# Patient Record
Sex: Female | Born: 1960 | Race: White | Hispanic: No | Marital: Married | Smoking: Never smoker
Health system: Southern US, Community
[De-identification: ages and names within clinical notes are randomized; demographics above are authoritative.]

## PROBLEM LIST (undated history)

## (undated) DIAGNOSIS — R002 Palpitations: Secondary | ICD-10-CM

## (undated) DIAGNOSIS — M778 Other enthesopathies, not elsewhere classified: Secondary | ICD-10-CM

## (undated) DIAGNOSIS — R0600 Dyspnea, unspecified: Secondary | ICD-10-CM

## (undated) DIAGNOSIS — R079 Chest pain, unspecified: Secondary | ICD-10-CM

## (undated) DIAGNOSIS — E039 Hypothyroidism, unspecified: Secondary | ICD-10-CM

## (undated) DIAGNOSIS — I10 Essential (primary) hypertension: Secondary | ICD-10-CM

## (undated) DIAGNOSIS — R42 Dizziness and giddiness: Secondary | ICD-10-CM

## (undated) DIAGNOSIS — M7581 Other shoulder lesions, right shoulder: Secondary | ICD-10-CM

## (undated) HISTORY — DX: Chest pain, unspecified: R07.9

## (undated) HISTORY — DX: Dyspnea, unspecified: R06.00

## (undated) HISTORY — DX: Dizziness and giddiness: R42

## (undated) HISTORY — DX: Other enthesopathies, not elsewhere classified: M77.8

## (undated) HISTORY — DX: Essential (primary) hypertension: I10

## (undated) HISTORY — DX: Other shoulder lesions, right shoulder: M75.81

## (undated) HISTORY — DX: Hypothyroidism, unspecified: E03.9

## (undated) HISTORY — DX: Palpitations: R00.2

---

## 2000-05-29 ENCOUNTER — Ambulatory Visit: Admission: RE | Admit: 2000-05-29 | Discharge: 2000-05-29 | Payer: Self-pay | Admitting: Pulmonary Disease

## 2000-09-06 ENCOUNTER — Encounter: Admission: RE | Admit: 2000-09-06 | Discharge: 2000-09-06 | Payer: Self-pay | Admitting: Obstetrics and Gynecology

## 2000-09-06 ENCOUNTER — Encounter: Payer: Self-pay | Admitting: Obstetrics and Gynecology

## 2002-02-24 ENCOUNTER — Other Ambulatory Visit: Admission: RE | Admit: 2002-02-24 | Discharge: 2002-02-24 | Payer: Self-pay | Admitting: Obstetrics and Gynecology

## 2003-04-05 ENCOUNTER — Other Ambulatory Visit: Admission: RE | Admit: 2003-04-05 | Discharge: 2003-04-05 | Payer: Self-pay | Admitting: Obstetrics and Gynecology

## 2004-08-30 ENCOUNTER — Ambulatory Visit: Payer: Self-pay | Admitting: Cardiology

## 2004-09-28 ENCOUNTER — Ambulatory Visit (HOSPITAL_COMMUNITY): Admission: RE | Admit: 2004-09-28 | Discharge: 2004-09-28 | Payer: Self-pay | Admitting: Allergy

## 2005-04-26 ENCOUNTER — Ambulatory Visit: Payer: Self-pay

## 2007-05-14 ENCOUNTER — Encounter: Admission: RE | Admit: 2007-05-14 | Discharge: 2007-05-14 | Payer: Self-pay | Admitting: Obstetrics and Gynecology

## 2007-12-02 ENCOUNTER — Ambulatory Visit: Payer: Self-pay | Admitting: Cardiovascular Disease

## 2007-12-02 LAB — CONVERTED CEMR LAB
BUN: 10 mg/dL (ref 6–23)
Basophils Absolute: 0 10*3/uL (ref 0.0–0.1)
Basophils Relative: 0.1 % (ref 0.0–1.0)
CO2: 28 meq/L (ref 19–32)
Calcium: 9 mg/dL (ref 8.4–10.5)
Chloride: 106 meq/L (ref 96–112)
Creatinine, Ser: 0.9 mg/dL (ref 0.4–1.2)
Eosinophils Absolute: 0.2 10*3/uL (ref 0.0–0.6)
Eosinophils Relative: 1.8 % (ref 0.0–5.0)
GFR calc Af Amer: 86 mL/min
GFR calc non Af Amer: 71 mL/min
Glucose, Bld: 93 mg/dL (ref 70–99)
HCT: 40.1 % (ref 36.0–46.0)
Hemoglobin: 13.3 g/dL (ref 12.0–15.0)
Lymphocytes Relative: 17.6 % (ref 12.0–46.0)
MCHC: 33.3 g/dL (ref 30.0–36.0)
MCV: 85.8 fL (ref 78.0–100.0)
Monocytes Absolute: 0.7 10*3/uL (ref 0.2–0.7)
Monocytes Relative: 7.7 % (ref 3.0–11.0)
Neutro Abs: 6.4 10*3/uL (ref 1.4–7.7)
Neutrophils Relative %: 72.8 % (ref 43.0–77.0)
Platelets: 316 10*3/uL (ref 150–400)
Potassium: 4.9 meq/L (ref 3.5–5.1)
RBC: 4.67 M/uL (ref 3.87–5.11)
RDW: 13.6 % (ref 11.5–14.6)
Sodium: 142 meq/L (ref 135–145)
TSH: 1.83 microintl units/mL (ref 0.35–5.50)
WBC: 8.9 10*3/uL (ref 4.5–10.5)

## 2007-12-11 ENCOUNTER — Encounter: Payer: Self-pay | Admitting: Cardiology

## 2007-12-11 ENCOUNTER — Ambulatory Visit: Payer: Self-pay

## 2008-01-25 ENCOUNTER — Encounter: Payer: Self-pay | Admitting: Cardiology

## 2010-01-17 ENCOUNTER — Ambulatory Visit: Payer: Self-pay | Admitting: Cardiology

## 2010-01-17 ENCOUNTER — Observation Stay (HOSPITAL_COMMUNITY): Admission: EM | Admit: 2010-01-17 | Discharge: 2010-01-18 | Payer: Self-pay | Admitting: Emergency Medicine

## 2010-02-10 DIAGNOSIS — R002 Palpitations: Secondary | ICD-10-CM

## 2010-02-10 DIAGNOSIS — R079 Chest pain, unspecified: Secondary | ICD-10-CM

## 2010-02-10 DIAGNOSIS — R42 Dizziness and giddiness: Secondary | ICD-10-CM

## 2010-02-10 DIAGNOSIS — R0602 Shortness of breath: Secondary | ICD-10-CM

## 2010-02-10 DIAGNOSIS — I10 Essential (primary) hypertension: Secondary | ICD-10-CM | POA: Insufficient documentation

## 2010-02-11 ENCOUNTER — Encounter: Payer: Self-pay | Admitting: Cardiology

## 2010-02-13 ENCOUNTER — Ambulatory Visit: Payer: Self-pay | Admitting: Cardiology

## 2010-04-19 ENCOUNTER — Encounter (INDEPENDENT_AMBULATORY_CARE_PROVIDER_SITE_OTHER): Payer: Self-pay | Admitting: *Deleted

## 2010-04-20 ENCOUNTER — Ambulatory Visit: Payer: Self-pay

## 2010-04-20 ENCOUNTER — Ambulatory Visit (HOSPITAL_COMMUNITY): Admission: RE | Admit: 2010-04-20 | Discharge: 2010-04-20 | Payer: Self-pay | Admitting: Cardiology

## 2010-04-20 ENCOUNTER — Encounter: Payer: Self-pay | Admitting: Cardiology

## 2010-04-20 ENCOUNTER — Encounter (INDEPENDENT_AMBULATORY_CARE_PROVIDER_SITE_OTHER): Payer: Self-pay

## 2010-04-20 ENCOUNTER — Ambulatory Visit: Payer: Self-pay | Admitting: Cardiology

## 2010-04-24 ENCOUNTER — Ambulatory Visit: Payer: Self-pay | Admitting: Cardiology

## 2010-06-07 ENCOUNTER — Telehealth (INDEPENDENT_AMBULATORY_CARE_PROVIDER_SITE_OTHER): Payer: Self-pay | Admitting: *Deleted

## 2010-08-24 ENCOUNTER — Encounter (INDEPENDENT_AMBULATORY_CARE_PROVIDER_SITE_OTHER): Payer: Self-pay | Admitting: *Deleted

## 2010-10-06 ENCOUNTER — Encounter (INDEPENDENT_AMBULATORY_CARE_PROVIDER_SITE_OTHER): Payer: Self-pay | Admitting: *Deleted

## 2010-10-11 ENCOUNTER — Ambulatory Visit: Payer: Self-pay | Admitting: Internal Medicine

## 2010-10-24 ENCOUNTER — Ambulatory Visit: Payer: Self-pay | Admitting: Internal Medicine

## 2010-11-30 NOTE — Progress Notes (Signed)
  Request recieved from [employer] for Long Term [facility] Coverage.Sent to [employer]. [facility]  June 07, 2010 8:33 AM

## 2010-11-30 NOTE — Letter (Signed)
Summary: Outpatience Coinsurance Notice  Outpatience Coinsurance Notice   Imported By: Marylou Mccoy 05/19/2010 15:32:53  _____________________________________________________________________  External Attachment:    Type:   Image     Comment:   External Document

## 2010-11-30 NOTE — Miscellaneous (Signed)
Summary: lec pREVISIT/PREP  Clinical Lists Changes  Medications: Added new medication of MOVIPREP 100 GM  SOLR (PEG-KCL-NACL-NASULF-NA ASC-C) As per prep instructions. - Signed Rx of MOVIPREP 100 GM  SOLR (PEG-KCL-NACL-NASULF-NA ASC-C) As per prep instructions.;  #1 x 0;  Signed;  Entered by: Wyona Almas RN;  Authorized by: Iva Boop MD, [facility];  Method used: [facility]  [place] [address], [place], [place]  [address], Ph: [phone] or [phone], Fax: [phone] Allergies: Changed allergy or adverse reaction from CELEBREX to CELEBREX Changed allergy or adverse reaction from LISINOPRIL to LISINOPRIL    Prescriptions: MOVIPREP 100 GM  SOLR (PEG-KCL-NACL-NASULF-NA ASC-C) As per prep instructions.  #1 x 0   Entered by:   Wyona Almas RN   Authorized by:   Iva Boop MD, [facility]   Signed by:   Wyona Almas RN on 10/11/2010   Method used:   [facility]        [facility]  [place] [address] (retail)       [address]       [place], [place]  [address]       Ph: [phone] or [phone]       Fax: [phone]   RxID:   [id number]

## 2010-11-30 NOTE — Procedures (Signed)
Summary: Colonoscopy  Patient: Veronica Stuart Note: All result statuses are Final unless otherwise noted.  Tests: (1) Colonoscopy (COL)   COL Colonoscopy           DONE     [facility]     [address]     [place], [place]  [address]           COLONOSCOPY PROCEDURE REPORT           PATIENT:  Veronica Stuart, Veronica Stuart  MR#:  [id number]     BIRTHDATE:  1961-05-30, 48 yrs. old  GENDER:  female     ENDOSCOPIST:  Iva Boop, MD, [facility]     REF. BY:  Paulita Cradle, N.P.     PROCEDURE DATE:  10/24/2010     PROCEDURE:  Colonoscopy 29562     ASA CLASS:  Class II     INDICATIONS:  Routine Risk Screening     MEDICATIONS:   Fentanyl 50 mcg, Versed 7 mg IV           DESCRIPTION OF PROCEDURE:   After the risks benefits and     alternatives of the procedure were thoroughly explained, informed     consent was obtained.  Digital rectal exam was performed and     revealed no abnormalities.   The LB 180AL [id number] endoscope was     introduced through the anus and advanced to the cecum, which was     identified by both the appendix and ileocecal valve, without     limitations.  The quality of the prep was excellent, using     MoviPrep.  The instrument was then slowly withdrawn as the colon     was fully examined. Insertion: 4:50 minutes Withdrawal: 7:17     minutes     <<PROCEDUREIMAGES>>           FINDINGS:  A normal appearing cecum, ileocecal valve, and     appendiceal orifice were identified. The ascending, hepatic     flexure, transverse, splenic flexure, descending, sigmoid colon,     and rectum appeared unremarkable.   Retroflexed views in the right     colon and rectum revealed no abnormalities.    The scope was then     withdrawn from the patient and the procedure completed.           COMPLICATIONS:  None     ENDOSCOPIC IMPRESSION:     1) Normal colonoscopy, excellent prep           REPEAT EXAM:  In 10 year(s) for routine screening colonoscopy.           Iva Boop, MD,  Veronica Stuart           CC:  Paulita Cradle, NP     The Patient           n.     Rosalie Doctor:   Iva Boop at 10/24/2010 10:14 AM           Kathrin Greathouse, [id number]  Note: An exclamation mark (!) indicates a result that was not dispersed into the flowsheet. Document Creation Date: 10/24/2010 10:14 AM _______________________________________________________________________  (1) Order result status: Final Collection or observation date-time: 10/24/2010 10:08 Requested date-time:  Receipt date-time:  Reported date-time:  Referring Physician:   Ordering Physician: Stan Head [phone]) Specimen Source:  Source: Launa Grill Order Number: [id number] Lab site:   Appended Document: Colonoscopy    Clinical Lists Changes  Observations: Added new observation  of COLONNXTDUE: 09/2020 (10/24/2010 13:33)

## 2010-11-30 NOTE — Assessment & Plan Note (Signed)
Summary: PER CHECK OUT/SF   Visit Type:  Follow-up Referring Provider:  Dohmeier Primary Provider:  Ignacia Bayley   CC:  chest pain.  History of Present Illness: The patient is seen for followup of chest pain and palpitations.  She is doing well today.  She been hospitalized briefly.  I saw her after the hospitalization and decision was made to proceed with a dobutamine stress echo.  He was done April 20, 2010.  It is completely normal.  Patient is seen back today feeling well.  She is not having palpitations and she's not having chest pain.  Current Medications (verified): 1)  Synthroid 88 Mcg Tabs (Levothyroxine Sodium) .... Once Daily 2)  Zyrtec Allergy 10 Mg Caps (Cetirizine Hcl) .... 1/2 Tab As Needed 3)  Ibuprofen 200 Mg Tabs (Ibuprofen) .... As Needed 4)  Acetaminophen 500 Mg  Caps (Acetaminophen) .... As Needed  Allergies (verified): 1)  ! Celebrex 2)  ! Lisinopril  Past History:  Past Medical History: PALPITATIONS (ICD-785.1) DYSPNEA (ICD-786.05) DIZZINESS (ICD-780.4) CHEST PAIN-UNSPECIFIED (ICD-786.50)   stress echo... April 20, 2010.... normal HYPERTENSION, UNSPECIFIED (ICD-401.9) hypothyroidism EF   65%...echo.Marland KitchenMarland Kitchen2/2009  /  EF normal.. stress echo... June, 2011  Review of Systems       Patient denies fever, chills, headache, sweats, rash, change in vision, change in hearing, chest pain, cough, nausea vomiting, urinary symptoms.  All other systems are reviewed and are negative.  Vital Signs:  Patient profile:   50 year old female Height:      63 inches Weight:      197 pounds BMI:     35.02 Pulse rate:   85 / minute BP sitting:   112 / 78  (left arm) Cuff size:   regular  Vitals Entered By: Hardin Negus, RMA (April 24, 2010 8:58 AM)  Physical Exam  General:  patient is stable. Eyes:  no xanthelasma. Neck:  no jugular venous distention. Lungs:  lungs are clear.  Respiratory effort is nonlabored. Heart:  cardiac exam reveals S1 and S2.  No clicks or  significant murmurs. Abdomen:  abdomen is soft. Extremities:  no peripheral edema. Psych:  patient is oriented to person time and place.  Affect is normal.   Impression & Recommendations:  Problem # 1:  PALPITATIONS (ICD-785.1)  The following medications were removed from the medication list:    Aspirin Ec 325 Mg Tbec (Aspirin) .Marland Kitchen... Take one tablet by mouth daily The patient is having no recurring palpitations.  Further workup is not needed.  Problem # 2:  DYSPNEA (ICD-786.05)  The following medications were removed from the medication list:    Aspirin Ec 325 Mg Tbec (Aspirin) .Marland Kitchen... Take one tablet by mouth daily There is no shortness of breath.  Further workup is not needed.  Problem # 3:  CHEST PAIN-UNSPECIFIED (ICD-786.50)  The following medications were removed from the medication list:    Aspirin Ec 325 Mg Tbec (Aspirin) .Marland Kitchen... Take one tablet by mouth daily Stress echo was normal.  There is no further workup planned.  Problem # 4:  HYPERTENSION, UNSPECIFIED (ICD-401.9)  The following medications were removed from the medication list:    Aspirin Ec 325 Mg Tbec (Aspirin) .Marland Kitchen... Take one tablet by mouth daily Blood pressure is controlled today.  No change in therapy.  No cardiology followup is being arranged.  I will be happy to see the patient in the future if needed.   Visit Type:  Follow-up Referring Provider:  Dohmeier Primary Provider:  Ignacia Bayley  CC:  chest pain.  History of Present Illness: The patient is seen for followup of chest pain and palpitations.  She is doing well today.  She been hospitalized briefly.  I saw her after the hospitalization and decision was made to proceed with a dobutamine stress echo.  He was done April 20, 2010.  It is completely normal.  Patient is seen back today feeling well.  She is not having palpitations and she's not having chest pain.  Appended Document: PER CHECK OUT/SF In the note above I listed no headaches.  This is  not correct.  I did discuss with the patient the fact that she was having headaches.  I have encouraged her to follow with her neurologist.

## 2010-11-30 NOTE — Miscellaneous (Signed)
Summary: IV for Dobutamine Echo  Clinical Lists Changes     IV 22 G Angiocath for Dobutamine Echo. Zonnique Norkus,RN.

## 2010-11-30 NOTE — Assessment & Plan Note (Signed)
Summary: F2Y   Visit Type:  post hospital Primary Provider:  Ignacia Bayley   CC:  palpitations.  History of Present Illness: The patient was recently hospitalized at [facility].  She had some palpitations and chest discomfort.  There was no MI and she was discharged home for followup with our cardiology team.  Historically she has had palpitations.  She was seen last in our office in February, 2009.  She had a normal echo in 2006.  Echo done in February 2009 revealed an ejection fraction of 65% with no significant valvular abnormalities.  Current Medications (verified): 1)  Aspirin Ec 325 Mg Tbec (Aspirin) .... Take One Tablet By Mouth Daily 2)  Synthroid 88 Mcg Tabs (Levothyroxine Sodium) .... Once Daily 3)  Zyrtec Allergy 10 Mg Caps (Cetirizine Hcl) .... 1/2 Tab As Needed 4)  Ibuprofen 200 Mg Tabs (Ibuprofen) .... As Needed 5)  Acetaminophen 500 Mg  Caps (Acetaminophen) .... As Needed  Allergies (verified): 1)  ! Celebrex 2)  ! Lisinopril  Past History:  Past Medical History: Last updated: 02/11/2010 PALPITATIONS (ICD-785.1) DYSPNEA (ICD-786.05) DIZZINESS (ICD-780.4) CHEST PAIN-UNSPECIFIED (ICD-786.50) HYPERTENSION, UNSPECIFIED (ICD-401.9) hypothyroidism EF   65%...echo.Marland KitchenMarland Kitchen2/2009   Family History: Reviewed history and no changes required. Family History of Cancer: several kinds in mother family Family History of Diabetes:  Family History of Thyroid Disease:   Social History: Reviewed history from 02/10/2010 and no changes required. Full Time -- teacher Married  Tobacco Use - Former.  1978 Alcohol Use - no Regular Exercise - no Drug Use - no  Review of Systems       Patient denies fever, chills, headache, sweats, rash, change in vision, change in hearing, chest pain, cough, nausea vomiting, urinary symptoms.  All other systems are reviewed and are negative.  Vital Signs:  Patient profile:   50 year old female Height:      63 inches Weight:       208 pounds BMI:     36.98 Pulse rate:   74 / minute BP sitting:   132 / 90  (left arm) Cuff size:   regular  Vitals Entered By: Hardin Negus, RMA (February 13, 2010 3:16 PM)  Physical Exam  General:  patient is stable.  She is overweight. Head:  head is atraumatic. Eyes:  no xanthelasma. Neck:  no jugular venous distention. Chest Wall:  no chest wall tenderness. Lungs:  lungs are clear.  Respiratory effort is nonlabored. Heart:  cardiac exam reveals S1 and S2.  No clicks or significant murmurs. Abdomen:  abdomen is soft. Msk:  no musculoskeletal deformities. Extremities:  no peripheral edema. Skin:  no skin rashes. Psych:  patient is oriented to person time and place.  Affect is normal.   Impression & Recommendations:  Problem # 1:  PALPITATIONS (ICD-785.1)  Her updated medication list for this problem includes:    Aspirin Ec 325 Mg Tbec (Aspirin) .Marland Kitchen... Take one tablet by mouth daily  Orders: EKG w/ Interpretation (93000) Dobutamine Echo (Dobutamine Echo)  EKG is done today and reviewed by me.  There is normal sinus rhythm.  Historically her palpitations have been benign.  Problem # 2:  DYSPNEA (ICD-786.05)  Her updated medication list for this problem includes:    Aspirin Ec 325 Mg Tbec (Aspirin) .Marland Kitchen... Take one tablet by mouth daily The patient is not having any dyspnea today.  Problem # 3:  CHEST PAIN-UNSPECIFIED (ICD-786.50)  Her updated medication list for this problem includes:    Aspirin Ec 325  Mg Tbec (Aspirin) .Marland Kitchen... Take one tablet by mouth daily Patient had some mild chest discomfort.  With this and palpitations he will be helpful to have up-to-date echo information.  We will proceed with a dobutamine stress echo.  I believe this will give [place] the best opportunity to get good data.  I considered putting her on a treadmill with the echo, but we note that she has not had exercise induced ectopy the past.  Orders: Dobutamine Echo (Dobutamine Echo)  Problem # 4:   HYPERTENSION, UNSPECIFIED (ICD-401.9)  Her updated medication list for this problem includes:    Aspirin Ec 325 Mg Tbec (Aspirin) .Marland Kitchen... Take one tablet by mouth daily Diastolic pressure is slightly increased today.  We'll obtain more information about her blood pressure when she is here for echo.  Patient Instructions: 1)  Your physician has requested that you have a dobutamine stess echocardiogram.  For further information please visit [email or web address]  Please follow instruction sheet as given. 2)  Follow up a few weeks after test

## 2010-11-30 NOTE — Letter (Signed)
Summary: [facility] Instructions  [facility]  [address] [place], [place] [address]   Phone: [phone]  Fax: [phone]       Veronica Stuart    [birth year]    MRN: [id number]        Procedure Day /Date:  Tuesday 10/24/2010     Arrival Time:  8:30 am     Procedure Time:  9:30 am     Location of Procedure:                    _ x_  [facility] (4th Floor)                        PREPARATION FOR COLONOSCOPY WITH MOVIPREP   Starting 5 days prior to your procedure Thursday 12/22 do not eat nuts, seeds, popcorn, corn, beans, peas,  salads, or any raw vegetables.  Do not take any fiber supplements (e.g. Metamucil, Citrucel, and Benefiber).  THE DAY BEFORE YOUR PROCEDURE         DATE: Monday 10/23/10 1.  Drink clear liquids the entire day-NO SOLID FOOD  2.  Do not drink anything colored red or purple.  Avoid juices with pulp.  No orange juice.  3.  Drink at least 64 oz. (8 glasses) of fluid/clear liquids during the day to prevent dehydration and help the prep work efficiently.  CLEAR LIQUIDS INCLUDE: Water Jello Ice Popsicles Tea (sugar ok, no milk/cream) Powdered fruit flavored drinks Coffee (sugar ok, no milk/cream) Gatorade Juice: apple, white grape, white cranberry  Lemonade Clear bullion, consomm, broth Carbonated beverages (any kind) Strained chicken noodle soup Hard Candy                             4.  In the morning, mix first dose of MoviPrep solution:    Empty 1 Pouch A and 1 Pouch B into the disposable container    Add lukewarm drinking water to the top line of the container. Mix to dissolve    Refrigerate (mixed solution should be used within 24 hrs)  5.  Begin drinking the prep at 5:00 p.m. The MoviPrep container is divided by 4 marks.   Every 15 minutes drink the solution down to the next mark (approximately 8 oz) until the full liter is complete.   6.  Follow completed prep with 16 oz of clear liquid of your choice  (Nothing red or purple).  Continue to drink clear liquids until bedtime.  7.  Before going to bed, mix second dose of MoviPrep solution:    Empty 1 Pouch A and 1 Pouch B into the disposable container    Add lukewarm drinking water to the top line of the container. Mix to dissolve    Refrigerate  THE DAY OF YOUR PROCEDURE      DATE: Tuesday 10/24/10  Beginning at 4:30 a.m. (5 hours before procedure):         1. Every 15 minutes, drink the solution down to the next mark (approx 8 oz) until the full liter is complete.  2. Follow completed prep with 16 oz. of clear liquid of your choice.    3. You may drink clear liquids until 7:30 am (2 HOURS BEFORE PROCEDURE).   MEDICATION INSTRUCTIONS  Unless otherwise instructed, you should take regular prescription medications with a small sip of water   as early as possible the morning of  your procedure.       OTHER INSTRUCTIONS  You will need a responsible adult at least 52 years of age to accompany you and drive you home.   This person must remain in the waiting room during your procedure.  Wear loose fitting clothing that is easily removed.  Leave jewelry and other valuables at home.  However, you may wish to bring a book to read or  an iPod/MP3 player to listen to music as you wait for your procedure to start.  Remove all body piercing jewelry and leave at home.  Total time from sign-in until discharge is approximately 2-3 hours.  You should go home directly after your procedure and rest.  You can resume normal activities the  day after your procedure.  The day of your procedure you should not:   Drive   Make legal decisions   Operate machinery   Drink alcohol   Return to work  You will receive specific instructions about eating, activities and medications before you leave.    The above instructions have been reviewed and explained to me by   Wyona Almas RN  October 11, 2010 4:53 PM     I fully understand  and can verbalize these instructions _____________________________ Date _________

## 2010-11-30 NOTE — Letter (Signed)
Summary: Pre Visit Letter Revised  [facility]  [address] [place], [place] [address]   Phone: [phone]  Fax: [phone]        08/24/2010 MRN: [id number] Veronica Stuart [address] [place], [place]  [address]             Procedure Date:  10/24/2010   Welcome to the Gastroenterology Division at [facility].    You are scheduled to see a nurse for your pre-procedure visit on 10/10/2010 at 4:30PM on the 3rd floor at [facility], [address]  We ask that you try to arrive at our office 15 minutes prior to your appointment time to allow for check-in.  Please take a minute to review the attached form.  If you answer "Yes" to one or more of the questions on the first page, we ask that you call the person listed at your earliest opportunity.  If you answer "No" to all of the questions, please complete the rest of the form and bring it to your appointment.    Your nurse visit will consist of discussing your medical and surgical history, your immediate family medical history, and your medications.   If you are unable to list all of your medications on the form, please bring the medication bottles to your appointment and we will list them.  We will need to be aware of both prescribed and over the counter drugs.  We will need to know exact dosage information as well.    Please be prepared to read and sign documents such as consent forms, a financial agreement, and acknowledgement forms.  If necessary, and with your consent, a friend or relative is welcome to sit-in on the nurse visit with you.  Please bring your insurance card so that we may make a copy of it.  If your insurance requires a referral to see a specialist, please bring your referral form from your primary care physician.  No co-pay is required for this nurse visit.     If you cannot keep your appointment, please call [phone] to cancel or reschedule prior to your appointment date.  This allows [place]  the opportunity to schedule an appointment for another patient in need of care.    Thank you for choosing [facility] for your medical needs.  We appreciate the opportunity to care for you.  Please visit [place] at our website  to learn more about our practice.  Sincerely, The Gastroenterology Division

## 2010-11-30 NOTE — Miscellaneous (Signed)
  Clinical Lists Changes  Observations: Added new observation of PAST MED HX: PALPITATIONS (ICD-785.1) DYSPNEA (ICD-786.05) DIZZINESS (ICD-780.4) CHEST PAIN-UNSPECIFIED (ICD-786.50) HYPERTENSION, UNSPECIFIED (ICD-401.9) hypothyroidism EF   65%...echo.Marland KitchenMarland Kitchen2/2009  (02/11/2010 16:06)       Past History:  Past Medical History: PALPITATIONS (ICD-785.1) DYSPNEA (ICD-786.05) DIZZINESS (ICD-780.4) CHEST PAIN-UNSPECIFIED (ICD-786.50) HYPERTENSION, UNSPECIFIED (ICD-401.9) hypothyroidism EF   65%...echo.Marland KitchenMarland Kitchen2/2009

## 2011-01-22 LAB — URINALYSIS, ROUTINE W REFLEX MICROSCOPIC
Bilirubin Urine: NEGATIVE
Glucose, UA: NEGATIVE mg/dL
Hgb urine dipstick: NEGATIVE
Ketones, ur: NEGATIVE mg/dL
Nitrite: NEGATIVE
Specific Gravity, Urine: 1.019 (ref 1.005–1.030)
pH: 7 (ref 5.0–8.0)

## 2011-01-22 LAB — BASIC METABOLIC PANEL
BUN: 12 mg/dL (ref 6–23)
BUN: 15 mg/dL (ref 6–23)
CO2: 25 mEq/L (ref 19–32)
CO2: 26 mEq/L (ref 19–32)
Calcium: 8.6 mg/dL (ref 8.4–10.5)
Chloride: 102 mEq/L (ref 96–112)
Creatinine, Ser: 0.82 mg/dL (ref 0.4–1.2)
Glucose, Bld: 98 mg/dL (ref 70–99)
Glucose, Bld: 99 mg/dL (ref 70–99)
Sodium: 140 mEq/L (ref 135–145)

## 2011-01-22 LAB — POCT CARDIAC MARKERS
CKMB, poc: <1 ng/mL — ABNORMAL LOW (ref 1.0–8.0)
Myoglobin, poc: 61.8 ng/mL (ref 12–200)
Troponin i, poc: <0.05 ng/mL (ref 0.00–0.09)

## 2011-01-22 LAB — CARDIAC PANEL(CRET KIN+CKTOT+MB+TROPI)
Relative Index: INVALID (ref 0.0–2.5)
Total CK: 65 U/L (ref 7–177)
Troponin I: <0.01 ng/mL (ref 0.00–0.06)

## 2011-01-22 LAB — CBC
HCT: 38 % (ref 36.0–46.0)
HCT: 40.6 % (ref 36.0–46.0)
Hemoglobin: 12.9 g/dL (ref 12.0–15.0)
MCHC: 33.5 g/dL (ref 30.0–36.0)
MCHC: 33.9 g/dL (ref 30.0–36.0)
MCV: 83.1 fL (ref 78.0–100.0)
Platelets: 261 10*3/uL (ref 150–400)
RBC: 4.64 MIL/uL (ref 3.87–5.11)
RDW: 14.4 % (ref 11.5–15.5)
WBC: 12.5 10*3/uL — ABNORMAL HIGH (ref 4.0–10.5)

## 2011-01-22 LAB — DIFFERENTIAL
Basophils Absolute: 0.1 10*3/uL (ref 0.0–0.1)
Monocytes Absolute: 0.7 10*3/uL (ref 0.1–1.0)
Neutrophils Relative %: 78 % — ABNORMAL HIGH (ref 43–77)

## 2011-01-22 LAB — BRAIN NATRIURETIC PEPTIDE: Pro B Natriuretic peptide (BNP): <30 pg/mL (ref 0.0–100.0)

## 2011-02-06 ENCOUNTER — Encounter: Payer: Self-pay | Admitting: Physician Assistant

## 2011-03-13 NOTE — Assessment & Plan Note (Signed)
[facility]ALTHCARE                            CARDIOLOGY OFFICE NOTE   SHERBY, MONCAYO                      MRN:          [id number]  DATE:12/02/2007                            DOB:          [birth year]    PRIMARY CARDIOLOGIST:  Luis Abed, MD, [facility].   PATIENT PROFILE:  A 50 year old Caucasian female with a prior history of  palpitations who presents for chest pain, dyspnea, and palpitations.   PROBLEM LIST:  1. Chest pain.  2. Dyspnea.  3. Palpitations.      a.     On April 26, 2005, 2D echocardiogram:  Normal LV size and       function.  Trace mitral regurgitation and tricuspid regurgitation.  4. Hypothyroidism.  5. History of fatigue.  6. History of multiple allergies.  7. New diagnosis of asthma.  8. Prior history of sarcoid, per previous notes.  9. Obesity.   HISTORY OF PRESENT ILLNESS:  A 50 year old Caucasian female with the  above problem list.  She was last seen in the clinic more than three  years ago.  She has noted a history of palpitations over the past 10-12  years that now occur daily, between 2-5 times a day at any time, that  she describes as hard and fast, or intense, in nature.  Lasting just a  couple of seconds and resolving spontaneously.  There has not been  associated lightheadedness or dizziness with these palpitations.  She  notes that they are more frequent and intense in nature than they had  been in the past.  For the past month, she has also had a constant 1/10  dull chest discomfort rather than pain, that does not change with  exertion and does not have any associated symptoms, per say.  There is  nothing she can do to make it better or worse, it is just an awareness  of a discomfort in her chest.  She has also had a month's worth of  dyspnea that she thought was more or less allergy-driven and saw an  allergist.  Was told she had asthma.  She was placed on Asmanex inhaler  but thinks that this has actually made  things worse.  Her dyspnea occurs  with speaking as well as with exertion.  She notes that after completing  a sentence, she will have to take a deep breath to regain her breath.  She has not noticed any great drop-off in her activity levels as a  result of her dyspnea but thinks that she has slowed her pace some.  She  denies PND, orthopnea, dizziness, syncope, edema, or early satiety.   ALLERGIES:  No known drug allergies.   HOME MEDICATIONS:  1. BC powders daily.  2. Synthroid 88 mcg daily.  3. Asmanex inhaler.   PHYSICAL EXAMINATION:  Blood pressure 152/86.  It is 150/80 on repeat.  Heart rate is 89.  Respirations 16.  Her weight is 204 pounds.  A pleasant white female in no acute distress.  Awake, alert and oriented  x3.  NECK:  No bruits or JVD.  LUNGS:  Respirations are regular and unlabored.  Clear to auscultation.  CARDIAC:  Regular.  S1 and S2 with an occasional extra systole.  ABDOMEN:  Round, soft, nontender, nondistended.  Bowel sounds present  x4.  EXTREMITIES:  Warm, dry, and pink.  No clubbing, cyanosis or edema.  Dorsalis pedis and posterior tibial pulses are 2+ and equal bilaterally.   ACCESSORY CLINICAL FINDINGS:  CBC, BMET, TSH, D-dimer, echo, 48-hour  Holter, and exercise Myoview are pending.   ASSESSMENT/PLAN:  1. Chest pain and dyspnea:  This has occurred over the past month.      The chest pain has been constant in nature and thus seems unlikely      that it would be cardiac in origin.  She is in no acute distress      today and says that she has 1/10 discomfort.  ECG shows no acute      changes.  We will go ahead and obtain an exercise Myoview to rule      out ischemia and provide her hopefully with some reassurance.  We      will also check a D-dimer to rule out pulmonary embolus.  If this      were to be abnormal, we will obtain a CT of her chest.  She has      also had some fatigue, for which reason we will get a CBC and BMET.  2. Palpitations:  This  has been chronic, although by her report      somewhat worse over the past couple of months.  She has daily      symptoms now, whereas in the past when she was evaluated with the      monitor, she had no symptoms during that period of time.  We will      go ahead and place a 48-hour Holter monitor on her since she has      symptoms 2-5 times a day.  I suspect she may be having some      symptomatic PACs, as she does have PACs on her EKG.  We will check      a TSH as well as electrolytes.  3. Hypertension:  This is not previously diagnosed.  She says normally      her blood pressures run 120/80.  Would advise her to check her      pressures daily over the next couple of days and if pressures      remain elevated, she should contact her primary Jaquel Coomer at [facility] for initiation of an antihypertensive.      With her history of palpitations, she may benefit from beta      blocker.  4. Hypothyroidism:  Her Synthroid dose was adjusted about a year ago.      We will follow up with a TSH today.   DISPOSITION:  We will obtain testing, as above, and arrange for followup  in a couple of weeks.      Nicolasa Ducking, ANP  Electronically Signed      Noralyn Pick. Eden Emms, MD, [facility]  Electronically Signed   CB/MedQ  DD: 12/02/2007  DT: 12/03/2007  Job #: [id number]

## 2011-03-16 NOTE — Assessment & Plan Note (Signed)
[facility]ALTHCARE                            CARDIOLOGY OFFICE NOTE   Veronica Stuart, Veronica Stuart                      MRN:          [id number]  DATE:12/04/2006                            DOB:          1961-09-30    The patient was seen in the office on December 02, 2007.  She was  assessed by Ward Givens, nurse practitioner, for palpitations and some  chest pain.  Studies were ordered.  Studies included a stress Myoview.  The patient's insurance company is refusing to allow her to have the  stress Myoview and feels that it would be appropriate for her to have a  standard treadmill.  I have reviewed her EKG.  She does not have any  significant resting S-T changes.   Stress Myoview is more sensitive and specific for coronary disease.  However, because her insurance company would prefer that she have a  standard treadmill, we will do a treadmill and then assess her further.     Luis Abed, MD, [facility]  Electronically Signed    JDK/MedQ  DD: 12/05/2007  DT: 12/06/2007  Job #: [id number]

## 2011-05-01 ENCOUNTER — Encounter: Payer: Self-pay | Admitting: Cardiology

## 2011-08-09 ENCOUNTER — Other Ambulatory Visit: Payer: Self-pay | Admitting: Obstetrics and Gynecology

## 2011-08-09 DIAGNOSIS — Z1231 Encounter for screening mammogram for malignant neoplasm of breast: Secondary | ICD-10-CM

## 2011-08-29 ENCOUNTER — Ambulatory Visit
Admission: RE | Admit: 2011-08-29 | Discharge: 2011-08-29 | Disposition: A | Discharge status: Home / Self Care | Payer: BC Managed Care – PPO | Source: Ambulatory Visit | Attending: Obstetrics and Gynecology | Admitting: Obstetrics and Gynecology

## 2011-08-29 DIAGNOSIS — Z1231 Encounter for screening mammogram for malignant neoplasm of breast: Secondary | ICD-10-CM

## 2013-01-07 ENCOUNTER — Other Ambulatory Visit: Payer: Self-pay | Admitting: Obstetrics and Gynecology

## 2013-01-07 DIAGNOSIS — Z780 Asymptomatic menopausal state: Secondary | ICD-10-CM

## 2013-02-02 ENCOUNTER — Telehealth: Payer: Self-pay | Admitting: Physician Assistant

## 2013-02-02 ENCOUNTER — Ambulatory Visit (INDEPENDENT_AMBULATORY_CARE_PROVIDER_SITE_OTHER): Payer: BC Managed Care – PPO | Admitting: Family Medicine

## 2013-02-02 ENCOUNTER — Encounter: Payer: Self-pay | Admitting: Family Medicine

## 2013-02-02 VITALS — BP 130/79 | HR 66 | Temp 97.6°F | Ht 63.5 in | Wt 159.6 lb

## 2013-02-02 DIAGNOSIS — R040 Epistaxis: Secondary | ICD-10-CM | POA: Insufficient documentation

## 2013-02-02 DIAGNOSIS — J209 Acute bronchitis, unspecified: Secondary | ICD-10-CM | POA: Insufficient documentation

## 2013-02-02 MED ORDER — AMOXICILLIN 875 MG PO TABS: 875.0000 mg | ORAL_TABLET | Freq: Two times a day (BID) | ORAL | Status: DC

## 2013-02-02 MED ORDER — MUPIROCIN 2 % EX OINT: TOPICAL_OINTMENT | Freq: Three times a day (TID) | CUTANEOUS | Status: DC

## 2013-02-02 NOTE — Telephone Encounter (Signed)
APPT MADE FOR PM CLINIC TODAY

## 2013-02-02 NOTE — Progress Notes (Signed)
Patient ID: Veronica Stuart, female   DOB: 1961-04-21, 52 y.o.   MRN: [id number] SUBJECTIVE:   HPI: Patient has had upper respiratory infection. Sinus congestion. Episodes of nose bleed. When she blows her nose hard there is more blood. Some runs back in her throat and cause her to spit up blood.some of the phlegm she coughs up is blood tinged but it does not come from the chest. It is from the nose bleed. No exposure to Tb, no night sweats , no night time fevers. No exposures to suspicious infectious disease.   PMH/PSH: reviewed/updated in Epic  SH/FH: reviewed/updated in Epic  Allergies: reviewed/updated in Epic  Medications: reviewed/updated in Epic  Immunizations: reviewed/updated in Epic   ROS: rest of the systematic review was negative.  OBJECTIVE:     APPEARANCE: WF NAD Patient in no acute distress.The patient appeared well nourished and normally developed. Acyanotic.  Waist:n/a  VITAL SIGNS:BP 130/79  Pulse 66  Temp(Src) 97.6 F (36.4 C) (Oral)  Ht 5' 3.5" (1.613 m)  Wt 159 lb 9.6 oz (72.394 kg)  BMI 27.82 kg/m2   SKIN: warm and  Dry without overt rashes, tattoos and scars  HEAD and Neck: without JVD, nose congested, medial walls of the nasal cavities has mucosal breaks/linear ulcerations. No active bleeding at present.. Post nasal drip. Throat clear , ears:NAD No scleral icterus  CHEST & LUNGS: coarse breath sounds.  CVS: Reveals the PMI to be normally located. Regular rhythm, First and Second Heart sounds are normal, and absence of murmurs, rubs or gallops.  ABDOMEN:  Benign,, no organomegaly, no masses, no Abdominal Aortic enlargement. No Guarding , no rebound. No Bruits.  RECTAL:n/a  GU:n/a  EXTREMETIES: nonedematous. Both Femoral and Pedal pulses are normal.  MUSCULOSKELETAL:  Spine: normal Joints: intact  NEUROLOGIC: oriented to time,place and person; nonfocal. Strength is normal Sensory is normal Reflexes are normal Cranial Nerves are  normal.  ASSESSMENT:  Acute bronchitis  Epistaxis  PLAN:                                 Meds ordered this encounter  Medications  . HYDROcodone-homatropine (HYCODAN) 5-1.5 MG/5ML syrup    Sig: Take 5 mLs by mouth every 8 (eight) hours as needed for cough.  Marland Kitchen amoxicillin (AMOXIL) 875 MG tablet    Sig: Take 1 tablet (875 mg total) by mouth 2 (two) times daily.    Dispense:  20 tablet    Refill:  0  . mupirocin ointment (BACTROBAN) 2 %    Sig: Apply topically 3 (three) times daily. To the nostrils for 5 days    Dispense:  22 g    Refill:  0  humifier. Nasal saline washes.. RTC prn. Avoid irritants to the nose.  Naiah Donahoe P. [place]esto Charon, M.D.

## 2013-03-31 ENCOUNTER — Telehealth: Payer: Self-pay | Admitting: Family Medicine

## 2013-03-31 NOTE — Telephone Encounter (Signed)
APPT MADE

## 2013-04-01 ENCOUNTER — Ambulatory Visit (INDEPENDENT_AMBULATORY_CARE_PROVIDER_SITE_OTHER): Payer: BC Managed Care – PPO | Admitting: Physician Assistant

## 2013-04-01 ENCOUNTER — Encounter: Payer: Self-pay | Admitting: Physician Assistant

## 2013-04-01 VITALS — BP 122/79 | HR 87 | Temp 98.2°F | Ht 63.5 in | Wt 158.8 lb

## 2013-04-01 DIAGNOSIS — J069 Acute upper respiratory infection, unspecified: Secondary | ICD-10-CM

## 2013-04-01 MED ORDER — AMOXICILLIN-POT CLAVULANATE 875-125 MG PO TABS: 1.0000 | ORAL_TABLET | Freq: Two times a day (BID) | ORAL | Status: DC

## 2013-04-01 NOTE — Patient Instructions (Signed)

## 2013-04-01 NOTE — Progress Notes (Signed)
Subjective:     Patient ID: Veronica Stuart, female   DOB: 10/25/1961, 52 y.o.   MRN: [id number]  HPI Pt with sinus pain and pressure for several days Sx are worsening + tactile fever, general malaise, and prod cough No N/V/D OTC meds minimal help  Review of Systems  All other systems reviewed and are negative.       Objective:   Physical Exam  Nursing note and vitals reviewed.  Ears- TM's canals nl Oral- no lesions or increase in tonsil size No cerv nodes Heart- RRR w/o M Lungs- CTA bilat + TTP of maxillary sinus    Assessment:     1. Acute upper respiratory infections of unspecified site        Plan:     Augmentin rx Cont OTC meds Fluids Rest  F/U prn

## 2013-06-11 ENCOUNTER — Other Ambulatory Visit: Payer: Self-pay

## 2013-06-11 MED ORDER — LEVOTHYROXINE SODIUM 88 MCG PO TABS: 88.0000 ug | ORAL_TABLET | Freq: Every day | ORAL | Status: DC

## 2013-06-11 NOTE — Telephone Encounter (Signed)
Last thyroid labs 7/13  ACM

## 2013-06-23 ENCOUNTER — Other Ambulatory Visit: Payer: Self-pay

## 2013-06-23 MED ORDER — LOSARTAN POTASSIUM 50 MG PO TABS: 50.0000 mg | ORAL_TABLET | Freq: Every day | ORAL | Status: DC

## 2013-07-13 ENCOUNTER — Ambulatory Visit (INDEPENDENT_AMBULATORY_CARE_PROVIDER_SITE_OTHER): Payer: BC Managed Care – PPO | Admitting: Family Medicine

## 2013-07-13 ENCOUNTER — Encounter: Payer: Self-pay | Admitting: Family Medicine

## 2013-07-13 VITALS — BP 108/72 | HR 76 | Temp 97.3°F | Ht 63.6 in | Wt 159.6 lb

## 2013-07-13 DIAGNOSIS — E039 Hypothyroidism, unspecified: Secondary | ICD-10-CM

## 2013-07-13 LAB — POCT CBC
Granulocyte percent: 65.2 %G (ref 37–80)
HCT, POC: 41.1 % (ref 37.7–47.9)
Hemoglobin: 13.8 g/dL (ref 12.2–16.2)
Lymph, poc: 2.1 (ref 0.6–3.4)
MCH, POC: 29.2 pg (ref 27–31.2)
MCHC: 33.6 g/dL (ref 31.8–35.4)
MCV: 86.9 fL (ref 80–97)
MPV: 6.7 fL (ref 0–99.8)
POC Granulocyte: 5 (ref 2–6.9)
POC LYMPH PERCENT: 27.2 %L (ref 10–50)
Platelet Count, POC: 221 10*3/uL (ref 142–424)
RBC: 4.7 M/uL (ref 4.04–5.48)
RDW, POC: 12.9 %
WBC: 7.7 10*3/uL (ref 4.6–10.2)

## 2013-07-13 MED ORDER — LEVOTHYROXINE SODIUM 88 MCG PO TABS: 88.0000 ug | ORAL_TABLET | Freq: Every day | ORAL | Status: DC

## 2013-07-13 NOTE — Patient Instructions (Signed)

## 2013-07-13 NOTE — Progress Notes (Signed)
  Subjective:    Patient ID: COURTNEY BELLIZZI, female    DOB: 08-14-1961, 52 y.o.   MRN: [id number]  HPI This 52 y.o. female presents for evaluation of refill on her thyroid medicine. She has not had labs in over a year..   Review of Systems No chest pain, SOB, HA, dizziness, vision change, N/V, diarrhea, constipation, dysuria, urinary urgency or frequency, myalgias, arthralgias or rash.     Objective:   Physical Exam Vital signs noted  Well developed well nourished female.  HEENT - Head atraumatic Normocephalic                Eyes - PERRLA, Conjuctiva - clear Sclera- Clear EOMI                Ears - EAC's Wnl TM's Wnl Gross Hearing WNL                Nose - Nares patent                 Throat - oropharanx wnl Respiratory - Lungs CTA bilateral Cardiac - RRR S1 and S2 without murmur GI - Abdomen soft Nontender and bowel sounds active x 4 Extremities - No edema. Neuro - Grossly intact.       Assessment & Plan:  Unspecified hypothyroidism - Plan: POCT CBC, CMP14+EGFR, Thyroid Panel With TSH, levothyroxine (SYNTHROID, LEVOTHROID) 88 MCG tablet  Follow up in 6 months

## 2013-07-14 LAB — CMP14+EGFR
ALT: 19 IU/L (ref 0–32)
AST: 26 IU/L (ref 0–40)
Albumin/Globulin Ratio: 2.8 — ABNORMAL HIGH (ref 1.1–2.5)
Albumin: 4.5 g/dL (ref 3.5–5.5)
Alkaline Phosphatase: 98 IU/L (ref 39–117)
BUN/Creatinine Ratio: 29 — ABNORMAL HIGH (ref 9–23)
BUN: 26 mg/dL — ABNORMAL HIGH (ref 6–24)
CO2: 27 mmol/L (ref 18–29)
Calcium: 9.3 mg/dL (ref 8.7–10.2)
Chloride: 103 mmol/L (ref 97–108)
Creatinine, Ser: 0.89 mg/dL (ref 0.57–1.00)
GFR calc Af Amer: 86 mL/min/{1.73_m2} (ref >59)
GFR calc non Af Amer: 74 mL/min/{1.73_m2} (ref >59)
Globulin, Total: 1.6 g/dL (ref 1.5–4.5)
Glucose: 77 mg/dL (ref 65–99)
Potassium: 5.2 mmol/L (ref 3.5–5.2)
Sodium: 143 mmol/L (ref 134–144)
Total Bilirubin: 0.3 mg/dL (ref 0.0–1.2)
Total Protein: 6.1 g/dL (ref 6.0–8.5)

## 2013-07-14 LAB — THYROID PANEL WITH TSH
Free Thyroxine Index: 1.9 (ref 1.2–4.9)
T3 Uptake Ratio: 34 % (ref 24–39)
T4, Total: 5.7 ug/dL (ref 4.5–12.0)
TSH: 2.26 u[IU]/mL (ref 0.450–4.500)

## 2013-09-03 ENCOUNTER — Other Ambulatory Visit: Payer: Self-pay

## 2013-11-18 ENCOUNTER — Ambulatory Visit (INDEPENDENT_AMBULATORY_CARE_PROVIDER_SITE_OTHER): Payer: BC Managed Care – PPO | Admitting: General Practice

## 2013-11-18 ENCOUNTER — Encounter: Payer: Self-pay | Admitting: General Practice

## 2013-11-18 VITALS — BP 123/84 | HR 75 | Temp 97.3°F | Ht 63.6 in | Wt 161.0 lb

## 2013-11-18 DIAGNOSIS — R509 Fever, unspecified: Secondary | ICD-10-CM

## 2013-11-18 DIAGNOSIS — J329 Chronic sinusitis, unspecified: Secondary | ICD-10-CM

## 2013-11-18 LAB — POCT INFLUENZA A/B
INFLUENZA A, POC: NEGATIVE
INFLUENZA B, POC: NEGATIVE

## 2013-11-18 MED ORDER — AZITHROMYCIN 250 MG PO TABS: ORAL_TABLET | ORAL | Status: DC

## 2013-11-18 NOTE — Patient Instructions (Signed)

## 2013-11-18 NOTE — Progress Notes (Signed)
   Subjective:    Patient ID: Veronica Stuart, female    DOB: 01-03-1961, 53 y.o.   MRN: [id number]  Fever  This is a new problem. The current episode started in the past 7 days. The problem occurs daily. The problem has been unchanged. The maximum temperature noted was 99 to 99.9 F. The temperature was taken using an oral thermometer. Associated symptoms include congestion, coughing, headaches and muscle aches. Pertinent negatives include no abdominal pain, chest pain, nausea, rash, sore throat or wheezing. She has tried acetaminophen for the symptoms. The treatment provided moderate relief.       Review of Systems  Constitutional: Positive for fever. Negative for chills.  HENT: Positive for congestion, postnasal drip and sinus pressure. Negative for rhinorrhea and sore throat.   Respiratory: Positive for cough. Negative for chest tightness and wheezing.   Cardiovascular: Negative for chest pain and palpitations.  Gastrointestinal: Negative for nausea and abdominal pain.  Skin: Negative for rash.  Neurological: Positive for headaches.       Objective:   Physical Exam  Constitutional: She appears well-developed and well-nourished.  HENT:  Head: Normocephalic and atraumatic.  Nose: Right sinus exhibits maxillary sinus tenderness and frontal sinus tenderness. Left sinus exhibits maxillary sinus tenderness and frontal sinus tenderness.  Mouth/Throat: Posterior oropharyngeal erythema present.  Cardiovascular: Normal rate, regular rhythm and normal heart sounds.   No murmur heard. Pulmonary/Chest: Effort normal and breath sounds normal. No respiratory distress. She exhibits no tenderness.  Neurological: She is alert.  Skin: Skin is warm and dry. No rash noted.  Psychiatric: She has a normal mood and affect.      Results for orders placed in visit on 11/18/13  POCT INFLUENZA A/B      Result Value Range   Influenza A, POC Negative     Influenza B, POC Negative            Assessment & Plan:  1. Fever  - POCT Influenza A/B  2. Sinusitis  - azithromycin (ZITHROMAX) 250 MG tablet; Take as directed  Dispense: 6 tablet; Refill: 0 -adequate fluids -tylenol or motrin for fever, mild discomfort  -RTO if symptoms worsen or unresolved Patient verbalized understanding Erby Pian, FNP-C

## 2014-01-17 ENCOUNTER — Other Ambulatory Visit: Payer: Self-pay | Admitting: Family Medicine

## 2014-01-19 NOTE — Telephone Encounter (Signed)
Can you review and refill? Last ov for checkup 9/14. Last acute visit 1/15.  See allergies.Thanks

## 2014-07-28 ENCOUNTER — Other Ambulatory Visit: Payer: Self-pay | Admitting: Family Medicine

## 2014-08-27 ENCOUNTER — Other Ambulatory Visit: Payer: Self-pay | Admitting: Family Medicine

## 2014-09-01 ENCOUNTER — Other Ambulatory Visit: Payer: Self-pay | Admitting: Family Medicine

## 2014-10-02 ENCOUNTER — Other Ambulatory Visit: Payer: Self-pay | Admitting: Family Medicine

## 2014-11-06 ENCOUNTER — Other Ambulatory Visit: Payer: Self-pay | Admitting: Family Medicine

## 2014-11-15 ENCOUNTER — Other Ambulatory Visit: Payer: Self-pay | Admitting: Family Medicine

## 2014-11-15 ENCOUNTER — Other Ambulatory Visit (INDEPENDENT_AMBULATORY_CARE_PROVIDER_SITE_OTHER): Payer: BC Managed Care – PPO

## 2014-11-15 DIAGNOSIS — E039 Hypothyroidism, unspecified: Secondary | ICD-10-CM | POA: Diagnosis not present

## 2014-11-15 DIAGNOSIS — I10 Essential (primary) hypertension: Secondary | ICD-10-CM

## 2014-11-15 MED ORDER — LEVOTHYROXINE SODIUM 88 MCG PO TABS: 88.0000 ug | ORAL_TABLET | Freq: Every day | ORAL | Status: DC

## 2014-11-15 NOTE — Telephone Encounter (Signed)
done

## 2014-11-18 NOTE — Progress Notes (Signed)
Encounter opened in error

## 2014-12-01 ENCOUNTER — Ambulatory Visit (INDEPENDENT_AMBULATORY_CARE_PROVIDER_SITE_OTHER): Payer: BC Managed Care – PPO | Admitting: Family Medicine

## 2014-12-01 ENCOUNTER — Encounter: Payer: Self-pay | Admitting: Family Medicine

## 2014-12-01 VITALS — BP 125/86 | HR 77 | Temp 97.8°F | Ht 63.6 in | Wt 168.0 lb

## 2014-12-01 DIAGNOSIS — E039 Hypothyroidism, unspecified: Secondary | ICD-10-CM

## 2014-12-01 MED ORDER — LEVOTHYROXINE SODIUM 88 MCG PO TABS: 88.0000 ug | ORAL_TABLET | Freq: Every day | ORAL | Status: DC

## 2014-12-01 NOTE — Patient Instructions (Signed)
Schedule mammogram.

## 2014-12-01 NOTE — Progress Notes (Signed)
   Subjective:    Patient ID: Veronica Stuart, female    DOB: Apr 25, 1961, 54 y.o.   MRN: [id number]  HPI  54 year old female comes in today to discuss chronic medical conditions including hypertension and hypothyroidism. She had recent blood work done by [facility] which showed completely normal labs including CMP TSH lipids CBC uric acid. We took time to go over and explain these results. She has been on 88 g of Synthroid or levothyroxine and she should stay on that dose based on her TSH.   Patient Active Problem List   Diagnosis Date Noted  . Acute bronchitis 02/02/2013  . Epistaxis 02/02/2013  . HYPERTENSION, UNSPECIFIED 02/10/2010  . DIZZINESS 02/10/2010  . PALPITATIONS 02/10/2010  . DYSPNEA 02/10/2010  . CHEST PAIN-UNSPECIFIED 02/10/2010   Outpatient Encounter Prescriptions as of 12/01/2014  Medication Sig  . acetaminophen (TYLENOL) 500 MG tablet Take 500 mg by mouth every 6 (six) hours as needed.    . cetirizine (ZYRTEC) 10 MG tablet Take 10 mg by mouth daily.    . Cholecalciferol (VITAMIN D3) 2000 UNITS TABS Take 1 capsule by mouth every 3 (three) days.  Marland Kitchen co-enzyme Q-10 30 MG capsule Take 30 mg by mouth 3 (three) times daily.  . Cyanocobalamin (B-12) 100 MCG TABS Take by mouth.  Marland Kitchen ibuprofen (ADVIL,MOTRIN) 200 MG tablet Take 200 mg by mouth every 6 (six) hours as needed.    Marland Kitchen levothyroxine (SYNTHROID, LEVOTHROID) 88 MCG tablet Take 1 tablet (88 mcg total) by mouth daily.  . Misc Natural Products (GLUCOS-CHONDROIT-MSM COMPLEX PO) Take by mouth.  . [DISCONTINUED] azithromycin (ZITHROMAX) 250 MG tablet Take as directed  . [DISCONTINUED] losartan (COZAAR) 50 MG tablet TAKE 1 TABLET BY MOUTH DAILY      Review of Systems  Constitutional: Negative.   HENT: Negative.   Eyes: Negative.   Respiratory: Negative.   Cardiovascular: Negative.   Gastrointestinal: Negative.   Endocrine: Negative.   Genitourinary: Negative.   Hematological: Negative.   Psychiatric/Behavioral: Negative.         Objective:   Physical Exam  Constitutional: She is oriented to person, place, and time. She appears well-developed and well-nourished.  Eyes: Conjunctivae and EOM are normal.  Neck: Normal range of motion. Neck supple.  Cardiovascular: Normal rate, regular rhythm and normal heart sounds.   Pulmonary/Chest: Effort normal and breath sounds normal.  Abdominal: Soft. Bowel sounds are normal.  Musculoskeletal: Normal range of motion.  Neurological: She is alert and oriented to person, place, and time. She has normal reflexes.  Skin: Skin is warm and dry.  Psychiatric: She has a normal mood and affect. Her behavior is normal. Thought content normal.     BP 125/86 mmHg  Pulse 77  Temp(Src) 97.8 F (36.6 C) (Oral)  Ht 5' 3.6" (1.615 m)  Wt 168 lb (76.204 kg)  BMI 29.22 kg/m2       Assessment & Plan:  1. Hypothyroidism, unspecified hypothyroidism type Continue supplement at same dose and recheck in 1 year  Wardell Honour MD

## 2015-03-09 LAB — HM MAMMOGRAPHY: HM Mammogram: NEGATIVE

## 2015-05-10 ENCOUNTER — Ambulatory Visit (INDEPENDENT_AMBULATORY_CARE_PROVIDER_SITE_OTHER): Payer: BC Managed Care – PPO | Admitting: Physician Assistant

## 2015-05-10 ENCOUNTER — Encounter: Payer: Self-pay | Admitting: Physician Assistant

## 2015-05-10 VITALS — BP 115/73 | HR 72 | Temp 97.5°F | Ht 63.6 in | Wt 173.0 lb

## 2015-05-10 DIAGNOSIS — D485 Neoplasm of uncertain behavior of skin: Secondary | ICD-10-CM

## 2015-05-10 DIAGNOSIS — L03113 Cellulitis of right upper limb: Secondary | ICD-10-CM | POA: Diagnosis not present

## 2015-05-10 MED ORDER — TRIAMCINOLONE ACETONIDE 0.1 % EX CREA: 1.0000 "application " | TOPICAL_CREAM | Freq: Two times a day (BID) | CUTANEOUS | Status: DC

## 2015-05-10 MED ORDER — DOXYCYCLINE HYCLATE 100 MG PO TABS: 100.0000 mg | ORAL_TABLET | Freq: Two times a day (BID) | ORAL | Status: DC

## 2015-05-10 NOTE — Patient Instructions (Signed)

## 2015-05-10 NOTE — Progress Notes (Signed)
   Subjective:    Patient ID: Veronica Stuart, female    DOB: April 26, 1961, 54 y.o.   MRN: [id number]  HPI 54 y/o female presents with worsening of reddening on right arm. She felt like she was "bitten" by something yesterday and there was a papule. Today the papule has developed to more of a "blister" and the redness is spreading. She was on her front porch when the episode occurred. She applied baking soda and cold compress with immediate relief, however the itching and redness recurred.     Review of Systems  Constitutional: Negative for fever, chills, diaphoresis and fatigue.  Skin: Positive for color change and rash (red rash on right arm ).       Negative for pain    Neurological: Negative for weakness, light-headedness, numbness and headaches.       Objective:   Physical Exam  Constitutional: She is oriented to person, place, and time. She appears well-developed and well-nourished.  Neurological: She is alert and oriented to person, place, and time.  Skin:  Localized annular patch of erythema on right upper arm , approximately 3cm x 5 cm   Psychiatric: She has a normal mood and affect. Her behavior is normal. Judgment and thought content normal.  Nursing note and vitals reviewed.         Assessment & Plan:  1. Cellulitis of right upper extremity  - doxycycline (VIBRA-TABS) 100 MG tablet; Take 1 tablet (100 mg total) by mouth 2 (two) times daily. With food  Dispense: 28 tablet; Refill: 0 - <.75mm lesion on right upper arm biopsied - Pathology - triamcinolone cream (KENALOG) 0.1 %; Apply 1 application topically 2 (two) times daily.  Dispense: 30 g; Refill: 0  . Neoplasm of uncertain behavior  - biopsied to rule out FB  F/u pending on path results  Tiffany A. Benjamin Stain PA-C

## 2015-05-12 ENCOUNTER — Telehealth: Payer: Self-pay | Admitting: Physician Assistant

## 2015-05-12 NOTE — Telephone Encounter (Signed)
Please monitor and call if becomes larger.

## 2015-05-12 NOTE — Telephone Encounter (Signed)
No just continue to watch- is area getting larger?

## 2015-05-16 LAB — PATHOLOGY

## 2015-05-26 ENCOUNTER — Ambulatory Visit: Payer: BC Managed Care – PPO | Admitting: Physician Assistant

## 2015-12-14 ENCOUNTER — Telehealth: Payer: Self-pay | Admitting: Family Medicine

## 2015-12-16 ENCOUNTER — Encounter: Payer: Self-pay | Admitting: *Deleted

## 2015-12-26 ENCOUNTER — Other Ambulatory Visit: Payer: Self-pay | Admitting: Nurse Practitioner

## 2015-12-29 ENCOUNTER — Telehealth: Payer: Self-pay | Admitting: Family Medicine

## 2016-01-03 ENCOUNTER — Telehealth: Payer: Self-pay | Admitting: Family Medicine

## 2016-01-03 MED ORDER — LEVOTHYROXINE SODIUM 88 MCG PO TABS: 88.0000 ug | ORAL_TABLET | Freq: Every day | ORAL | Status: DC

## 2016-01-03 NOTE — Telephone Encounter (Signed)
RX refilled  

## 2016-01-03 NOTE — Telephone Encounter (Signed)
Patient informed that prescription was sent to pharmacy.  

## 2016-01-10 ENCOUNTER — Ambulatory Visit: Payer: BC Managed Care – PPO | Admitting: Family

## 2016-02-09 ENCOUNTER — Ambulatory Visit: Payer: BC Managed Care – PPO | Admitting: Family

## 2016-10-16 ENCOUNTER — Encounter: Payer: Self-pay | Admitting: Family

## 2016-10-16 ENCOUNTER — Ambulatory Visit (INDEPENDENT_AMBULATORY_CARE_PROVIDER_SITE_OTHER): Payer: BC Managed Care – PPO | Admitting: Family

## 2016-10-16 VITALS — BP 121/82 | HR 88 | Temp 98.0°F | Ht 63.6 in | Wt 189.0 lb

## 2016-10-16 DIAGNOSIS — H8113 Benign paroxysmal vertigo, bilateral: Secondary | ICD-10-CM

## 2016-10-16 DIAGNOSIS — E039 Hypothyroidism, unspecified: Secondary | ICD-10-CM | POA: Diagnosis not present

## 2016-10-16 DIAGNOSIS — J301 Allergic rhinitis due to pollen: Secondary | ICD-10-CM | POA: Diagnosis not present

## 2016-10-16 MED ORDER — MECLIZINE HCL 25 MG PO TABS: 25.0000 mg | ORAL_TABLET | Freq: Three times a day (TID) | ORAL | 0 refills | Status: DC | PRN

## 2016-10-16 MED ORDER — FLUTICASONE PROPIONATE 50 MCG/ACT NA SUSP: 2.0000 | Freq: Every day | NASAL | 6 refills | Status: DC

## 2016-10-16 MED ORDER — LEVOTHYROXINE SODIUM 88 MCG PO TABS: 88.0000 ug | ORAL_TABLET | Freq: Every day | ORAL | 3 refills | Status: DC

## 2016-10-16 NOTE — Patient Instructions (Signed)
Benign Positional Vertigo Introduction Vertigo is the feeling that you or your surroundings are moving when they are not. Benign positional vertigo is the most common form of vertigo. The cause of this condition is not serious (is benign). This condition is triggered by certain movements and positions (is positional). This condition can be dangerous if it occurs while you are doing something that could endanger you or others, such as driving. What are the causes? In many cases, the cause of this condition is not known. It may be caused by a disturbance in an area of the inner ear that helps your brain to sense movement and balance. This disturbance can be caused by a viral infection (labyrinthitis), head injury, or repetitive motion. What increases the risk? This condition is more likely to develop in:  Women.  People who are 50 years of age or older. What are the signs or symptoms? Symptoms of this condition usually happen when you move your head or your eyes in different directions. Symptoms may start suddenly, and they usually last for less than a minute. Symptoms may include:  Loss of balance and falling.  Feeling like you are spinning or moving.  Feeling like your surroundings are spinning or moving.  Nausea and vomiting.  Blurred vision.  Dizziness.  Involuntary eye movement (nystagmus). Symptoms can be mild and cause only slight annoyance, or they can be severe and interfere with daily life. Episodes of benign positional vertigo may return (recur) over time, and they may be triggered by certain movements. Symptoms may improve over time. How is this diagnosed? This condition is usually diagnosed by medical history and a physical exam of the head, neck, and ears. You may be referred to a health care provider who specializes in ear, nose, and throat (ENT) problems (otolaryngologist) or a provider who specializes in disorders of the nervous system (neurologist). You may have  additional testing, including:  MRI.  A CT scan.  Eye movement tests. Your health care provider may ask you to change positions quickly while he or she watches you for symptoms of benign positional vertigo, such as nystagmus. Eye movement may be tested with an electronystagmogram (ENG), caloric stimulation, the Dix-Hallpike test, or the roll test.  An electroencephalogram (EEG). This records electrical activity in your brain.  Hearing tests. How is this treated? Usually, your health care provider will treat this by moving your head in specific positions to adjust your inner ear back to normal. Surgery may be needed in severe cases, but this is rare. In some cases, benign positional vertigo may resolve on its own in 2-4 weeks. Follow these instructions at home: Safety  Move slowly.Avoid sudden body or head movements.  Avoid driving.  Avoid operating heavy machinery.  Avoid doing any tasks that would be dangerous to you or others if a vertigo episode would occur.  If you have trouble walking or keeping your balance, try using a cane for stability. If you feel dizzy or unstable, sit down right away.  Return to your normal activities as told by your health care provider. Ask your health care provider what activities are safe for you. General instructions  Take over-the-counter and prescription medicines only as told by your health care provider.  Avoid certain positions or movements as told by your health care provider.  Drink enough fluid to keep your urine clear or pale yellow.  Keep all follow-up visits as told by your health care provider. This is important. Contact a health care provider if:    You have a fever.  Your condition gets worse or you develop new symptoms.  Your family or friends notice any behavioral changes.  Your nausea or vomiting gets worse.  You have numbness or a "pins and needles" sensation. Get help right away if:  You have difficulty speaking or  moving.  You are always dizzy.  You faint.  You develop severe headaches.  You have weakness in your legs or arms.  You have changes in your hearing or vision.  You develop a stiff neck.  You develop sensitivity to light. This information is not intended to replace advice given to you by your health care provider. Make sure you discuss any questions you have with your health care provider. Document Released: 07/23/2006 Document Revised: 03/22/2016 Document Reviewed: 02/07/2015  2017 [employer]vier  

## 2016-10-16 NOTE — Progress Notes (Addendum)
   Subjective:    Patient ID: Veronica Stuart, female    DOB: 06/11/1961, 55 y.o.   MRN: [id number]  PT presents to the office today with recurrent dizziness. PT states she has had vertigo three different times in the year. PT states she always starts after she bends over.  Dizziness  This is a recurrent problem. The current episode started more than 1 year ago. The problem occurs intermittently. The problem has been waxing and waning. Pertinent negatives include no anorexia, arthralgias, change in bowel habit, congestion, diaphoresis, neck pain, swollen glands or urinary symptoms. The symptoms are aggravated by bending. She has tried lying down and rest for the symptoms. The treatment provided mild relief.  Thyroid Problem  Presents for follow-up visit. Symptoms include weight gain. Patient reports no cold intolerance, depressed mood, diaphoresis, hair loss or hoarse voice. The symptoms have been stable.      Review of Systems  Constitutional: Positive for weight gain. Negative for diaphoresis.  HENT: Negative for congestion and hoarse voice.   Gastrointestinal: Negative for anorexia and change in bowel habit.  Endocrine: Negative for cold intolerance.  Musculoskeletal: Negative for arthralgias and neck pain.  Neurological: Positive for dizziness.  All other systems reviewed and are negative.      Objective:   Physical Exam  Constitutional: She is oriented to person, place, and time. She appears well-developed and well-nourished. No distress.  HENT:  Head: Normocephalic and atraumatic.  Right Ear: External ear normal.  Left Ear: External ear normal.  Nose: Mucosal edema and rhinorrhea present.  Mouth/Throat: Posterior oropharyngeal erythema present.  Eyes: Pupils are equal, round, and reactive to light.  Neck: Normal range of motion. Neck supple. No thyromegaly present.  Cardiovascular: Normal rate, regular rhythm, normal heart sounds and intact distal pulses.   No murmur  heard. Pulmonary/Chest: Effort normal and breath sounds normal. No respiratory distress. She has no wheezes.  Abdominal: Soft. Bowel sounds are normal. She exhibits no distension. There is no tenderness.  Musculoskeletal: Normal range of motion. She exhibits no edema or tenderness.  Neurological: She is alert and oriented to person, place, and time. She has normal reflexes. No cranial nerve deficit.  Skin: Skin is warm and dry.  Psychiatric: She has a normal mood and affect. Her behavior is normal. Judgment and thought content normal.  Vitals reviewed.    BP 121/82   Pulse 88   Temp 98 F (36.7 C) (Oral)   Ht 5' 3.6" (1.615 m)   Wt 189 lb (85.7 kg)   BMI 32.85 kg/m       Assessment & Plan:  1. Benign paroxysmal positional vertigo due to bilateral vestibular disorder -Avoid fast movements Exercises discussed - Ambulatory referral to ENT - meclizine (ANTIVERT) 25 MG tablet; Take 1 tablet (25 mg total) by mouth 3 (three) times daily as needed for dizziness.  Dispense: 30 tablet; Refill: 0 - fluticasone (FLONASE) 50 MCG/ACT nasal spray; Place 2 sprays into both nostrils daily.  Dispense: 16 g; Refill: 6  2. Allergic rhinitis due to pollen, unspecified chronicity, unspecified seasonality -Avoid allergens  Started on Flonase  - fluticasone (FLONASE) 50 MCG/ACT nasal spray; Place 2 sprays into both nostrils daily.  Dispense: 16 g; Refill: 6  3. Hypothyroidism, unspecified type  - Thyroid Panel With TSH   Evelina Dun, FNP

## 2016-10-16 NOTE — Addendum Note (Signed)
Addended by: Evelina Dun A on: 10/16/2016 04:10 PM   Modules accepted: Orders

## 2016-10-17 LAB — THYROID PANEL WITH TSH
Free Thyroxine Index: 1.7 (ref 1.2–4.9)
T3 UPTAKE RATIO: 28 % (ref 24–39)
T4 TOTAL: 5.9 ug/dL (ref 4.5–12.0)
TSH: 2.26 u[IU]/mL (ref 0.450–4.500)

## 2016-12-24 ENCOUNTER — Other Ambulatory Visit: Payer: Self-pay | Admitting: Family

## 2017-05-09 ENCOUNTER — Telehealth: Payer: Self-pay | Admitting: Family Medicine

## 2017-05-09 NOTE — Telephone Encounter (Signed)
Had at [facility] few weeks ago

## 2017-09-27 ENCOUNTER — Other Ambulatory Visit: Payer: Self-pay | Admitting: Family

## 2017-10-24 ENCOUNTER — Other Ambulatory Visit: Payer: Self-pay | Admitting: *Deleted

## 2017-10-25 ENCOUNTER — Other Ambulatory Visit: Payer: Self-pay | Admitting: Family Medicine

## 2017-10-25 NOTE — Telephone Encounter (Signed)
Last  Thyroid 12/17

## 2017-11-05 ENCOUNTER — Encounter: Payer: Self-pay | Admitting: Family

## 2017-11-05 ENCOUNTER — Ambulatory Visit: Payer: BC Managed Care – PPO | Admitting: Family

## 2017-11-05 VITALS — BP 123/75 | HR 81 | Temp 97.0°F | Ht 63.6 in | Wt 189.0 lb

## 2017-11-05 DIAGNOSIS — Z0000 Encounter for general adult medical examination without abnormal findings: Secondary | ICD-10-CM | POA: Diagnosis not present

## 2017-11-05 DIAGNOSIS — Z23 Encounter for immunization: Secondary | ICD-10-CM | POA: Diagnosis not present

## 2017-11-05 DIAGNOSIS — E039 Hypothyroidism, unspecified: Secondary | ICD-10-CM

## 2017-11-05 MED ORDER — LEVOTHYROXINE SODIUM 88 MCG PO TABS: 88.0000 ug | ORAL_TABLET | Freq: Every day | ORAL | 3 refills | Status: DC

## 2017-11-05 NOTE — Addendum Note (Signed)
Addended by: Shelbie Ammons on: 11/05/2017 02:20 PM   Modules accepted: Orders

## 2017-11-05 NOTE — Patient Instructions (Signed)

## 2017-11-05 NOTE — Progress Notes (Signed)
Subjective:    Patient ID: Veronica Stuart, female    DOB: [birth year], 59 y.o.   MRN: [id number]  Pt presents to the office today for CPE without pap. Pt denies any headache, palpitations, SOB, or edema at this time.  Thyroid Problem  Presents for follow-up visit. Patient reports no anxiety, depressed mood, diaphoresis, fatigue or leg swelling. The symptoms have been stable.      Review of Systems  Constitutional: Negative for diaphoresis and fatigue.  Psychiatric/Behavioral: The patient is not nervous/anxious.   All other systems reviewed and are negative.  Family History  Problem Relation Age of Onset  . Diabetes Mother   . Hyperlipidemia Mother   . Hypertension Mother   . Cancer Unknown        several kinds on mothers side  . Diabetes Unknown        family hx  . Thyroid disease Unknown        family hx    Social History   Socioeconomic History  . Marital status: Married    Spouse name: None  . Number of children: None  . Years of education: None  . Highest education level: None  Social Needs  . Financial resource strain: None  . Food insecurity - worry: None  . Food insecurity - inability: None  . Transportation needs - medical: None  . Transportation needs - non-medical: None  Occupational History  . None  Tobacco Use  . Smoking status: Never Smoker  . Smokeless tobacco: Never Used  . Tobacco comment: quit in 1978   Substance and Sexual Activity  . Alcohol use: No  . Drug use: No  . Sexual activity: None  Other Topics Concern  . None  Social History Narrative   Full time Games developer.    Married.   Does not get regular exercise.         Objective:   Physical Exam  Constitutional: She is oriented to person, place, and time. She appears well-developed and well-nourished. No distress.  HENT:  Head: Normocephalic and atraumatic.  Right Ear: External ear normal.  Left Ear: External ear normal.  Nose: Nose normal.  Mouth/Throat: Oropharynx is clear  and moist.  Eyes: Pupils are equal, round, and reactive to light.  Neck: Normal range of motion. Neck supple. No thyromegaly present.  Cardiovascular: Normal rate, regular rhythm, normal heart sounds and intact distal pulses.  No murmur heard. Pulmonary/Chest: Effort normal and breath sounds normal. No respiratory distress. She has no wheezes.  Abdominal: Soft. Bowel sounds are normal. She exhibits no distension. There is no tenderness.  Musculoskeletal: Normal range of motion. She exhibits no edema or tenderness.  Neurological: She is alert and oriented to person, place, and time.  Skin: Skin is warm and dry.  Psychiatric: She has a normal mood and affect. Her behavior is normal. Judgment and thought content normal.  Vitals reviewed.    BP 123/75   Pulse 81   Temp (!) 97 F (36.1 C) (Oral)   Ht 5' 3.6" (1.615 m)   Wt 189 lb (85.7 kg)   BMI 32.85 kg/m      Assessment & Plan:  1. Annual physical exam - Anemia Profile B - CMP14+EGFR - Lipid panel - TSH - VITAMIN D 25 Hydroxy (Vit-D Deficiency, Fractures)  2. Hypothyroidism, unspecified type - CMP14+EGFR - levothyroxine (SYNTHROID, LEVOTHROID) 88 MCG tablet; Take 1 tablet (88 mcg total) by mouth daily.  Dispense: 90 tablet; Refill: 3   Continue all  meds Labs pending Health Maintenance reviewed Diet and exercise encouraged RTO 1 year   Evelina Dun, FNP

## 2017-11-06 ENCOUNTER — Other Ambulatory Visit: Payer: Self-pay | Admitting: Family

## 2017-11-06 LAB — ANEMIA PROFILE B
BASOS: 1 %
Basophils Absolute: 0.1 10*3/uL (ref 0.0–0.2)
EOS (ABSOLUTE): 0.3 10*3/uL (ref 0.0–0.4)
EOS: 4 %
FERRITIN: 73 ng/mL (ref 15–150)
Folate: 5.5 ng/mL (ref >3.0)
HEMATOCRIT: 43.8 % (ref 34.0–46.6)
HEMOGLOBIN: 14.7 g/dL (ref 11.1–15.9)
IMMATURE GRANS (ABS): 0 10*3/uL (ref 0.0–0.1)
IRON SATURATION: 17 % (ref 15–55)
IRON: 62 ug/dL (ref 27–159)
Immature Granulocytes: 0 %
LYMPHS: 28 %
Lymphocytes Absolute: 2.2 10*3/uL (ref 0.7–3.1)
MCH: 29.3 pg (ref 26.6–33.0)
MCHC: 33.6 g/dL (ref 31.5–35.7)
MCV: 87 fL (ref 79–97)
MONOCYTES: 7 %
Monocytes Absolute: 0.5 10*3/uL (ref 0.1–0.9)
NEUTROS ABS: 4.9 10*3/uL (ref 1.4–7.0)
Neutrophils: 60 %
Platelets: 239 10*3/uL (ref 150–379)
RBC: 5.01 x10E6/uL (ref 3.77–5.28)
RDW: 14.1 % (ref 12.3–15.4)
RETIC CT PCT: 1.2 % (ref 0.6–2.6)
Total Iron Binding Capacity: 360 ug/dL (ref 250–450)
UIBC: 298 ug/dL (ref 131–425)
VITAMIN B 12: 1116 pg/mL (ref 232–1245)
WBC: 8.1 10*3/uL (ref 3.4–10.8)

## 2017-11-06 LAB — CMP14+EGFR
A/G RATIO: 2 (ref 1.2–2.2)
ALBUMIN: 4.3 g/dL (ref 3.5–5.5)
ALT: 19 IU/L (ref 0–32)
AST: 19 IU/L (ref 0–40)
Alkaline Phosphatase: 124 IU/L — ABNORMAL HIGH (ref 39–117)
BILIRUBIN TOTAL: 0.2 mg/dL (ref 0.0–1.2)
BUN / CREAT RATIO: 24 — AB (ref 9–23)
BUN: 18 mg/dL (ref 6–24)
CALCIUM: 8.9 mg/dL (ref 8.7–10.2)
CHLORIDE: 105 mmol/L (ref 96–106)
CO2: 20 mmol/L (ref 20–29)
Creatinine, Ser: 0.74 mg/dL (ref 0.57–1.00)
GFR calc non Af Amer: 90 mL/min/{1.73_m2} (ref >59)
GFR, EST AFRICAN AMERICAN: 103 mL/min/{1.73_m2} (ref >59)
GLOBULIN, TOTAL: 2.2 g/dL (ref 1.5–4.5)
Glucose: 83 mg/dL (ref 65–99)
POTASSIUM: 4 mmol/L (ref 3.5–5.2)
Sodium: 143 mmol/L (ref 134–144)
TOTAL PROTEIN: 6.5 g/dL (ref 6.0–8.5)

## 2017-11-06 LAB — LIPID PANEL
CHOL/HDL RATIO: 3.7 ratio (ref 0.0–4.4)
Cholesterol, Total: 219 mg/dL — ABNORMAL HIGH (ref 100–199)
HDL: 59 mg/dL (ref >39)
LDL Calculated: 130 mg/dL — ABNORMAL HIGH (ref 0–99)
Triglycerides: 151 mg/dL — ABNORMAL HIGH (ref 0–149)
VLDL Cholesterol Cal: 30 mg/dL (ref 5–40)

## 2017-11-06 LAB — VITAMIN D 25 HYDROXY (VIT D DEFICIENCY, FRACTURES): VIT D 25 HYDROXY: 32.4 ng/mL (ref 30.0–100.0)

## 2017-11-06 LAB — TSH: TSH: 1.03 u[IU]/mL (ref 0.450–4.500)

## 2017-11-06 MED ORDER — ATORVASTATIN CALCIUM 20 MG PO TABS: 20.0000 mg | ORAL_TABLET | Freq: Every day | ORAL | 1 refills | Status: DC

## 2018-04-03 ENCOUNTER — Ambulatory Visit: Payer: BC Managed Care – PPO | Admitting: Family

## 2018-04-03 ENCOUNTER — Encounter: Payer: Self-pay | Admitting: Family

## 2018-04-03 VITALS — BP 118/81 | HR 93 | Temp 98.4°F | Ht 63.6 in | Wt 173.0 lb

## 2018-04-03 DIAGNOSIS — W57XXXA Bitten or stung by nonvenomous insect and other nonvenomous arthropods, initial encounter: Secondary | ICD-10-CM | POA: Diagnosis not present

## 2018-04-03 DIAGNOSIS — S30861A Insect bite (nonvenomous) of abdominal wall, initial encounter: Secondary | ICD-10-CM

## 2018-04-03 MED ORDER — DOXYCYCLINE HYCLATE 100 MG PO TABS: 100.0000 mg | ORAL_TABLET | Freq: Two times a day (BID) | ORAL | 0 refills | Status: DC

## 2018-04-03 NOTE — Patient Instructions (Signed)
Insect Bite, Adult An insect bite can make your skin red, itchy, and swollen. An insect bite is different from an insect sting, which happens when an insect injects poison (venom) into the skin. Some insects can spread disease to people through a bite. However, most insect bites do not lead to disease and are not serious. What are the causes? Insects may bite for a variety of reasons, including:  Hunger.  To defend themselves.  Insects that bite include:  Spiders.  Mosquitoes.  Ticks.  Fleas.  Ants.  Flies.  Bedbugs.  What are the signs or symptoms? Symptoms of this condition include:  Itching or pain in the bite area.  Redness and swelling in the bite area.  An open wound (skin ulcer).  In many cases, symptoms last for 2-4 days. How is this diagnosed? This condition is usually diagnosed based on symptoms and a physical exam. How is this treated? Treatment is usually not needed. Symptoms often go away on their own. When treatment is recommended, it may involve:  Applying a cream or lotion to the bitten area. This treatment helps with itching.  Taking an antibiotic medicine. This treatment is needed if the bite area gets infected.  Getting a tetanus shot.  Applying ice to the affected area.  Medicines called antihistamines. This treatment is needed if you develop an allergic reaction to the insect bite.  Follow these instructions at home: Bite area care  Do not scratch the bite area.  Keep the bite area clean and dry. Wash it every day with soap and water as told by your health care provider.  Check the bite area every day for signs of infection. Check for: ? More redness, swelling, or pain. ? Fluid or blood. ? Warmth. ? Pus. Managing pain, itching, and swelling   You may apply a baking soda paste, cortisone cream, or calamine lotion to the bite area as told by your health care provider.  If directed, applyice to the bite area. ? Put ice in a  plastic bag. ? Place a towel between your skin and the bag. ? Leave the ice on for 20 minutes, 2-3 times per day. Medicines  Apply or take over-the-counter and prescription medicines only as told by your health care provider.  If you were prescribed an antibiotic medicine, use it as told by your health care provider. Do not stop using the antibiotic even if your condition improves. General instructions  Keep all follow-up visits as told by your health care provider. This is important. How is this prevented? To help reduce your risk of insect bites:  When you are outdoors, wear clothing that covers your arms and legs.  Use insect repellent. The best insect repellents contain: ? DEET, picaridin, oil of lemon eucalyptus (OLE), or IR3535. ? Higher amounts of an active ingredient.  If your home windows do not have screens, consider installing them.  Contact a health care provider if:  You have more redness, swelling, or pain in the bite area.  You have fluid, blood, or pus coming from the bite area.  The bite area feels warm to the touch.  You have a fever. Get help right away if:  You have joint pain.  You have a rash.  You have shortness of breath.  You feel unusually tired or sleepy.  You have neck pain.  You have a headache.  You have unusual weakness.  You have chest pain.  You have nausea, vomiting, or pain in the abdomen. This   information is not intended to replace advice given to you by your health care provider. Make sure you discuss any questions you have with your health care provider. Document Released: 11/22/2004 Document Revised: 06/13/2016 Document Reviewed: 04/23/2016 Elsevier Interactive Patient Education  2018 [employer]  

## 2018-04-03 NOTE — Progress Notes (Signed)
   Subjective:    Patient ID: Veronica Stuart, female    DOB: [birth year], 56 y.o.   MRN: [id number]  Chief Complaint  Patient presents with  . Insect Bite    Rash  This is a new problem. The current episode started in the past 7 days. The problem is unchanged. The affected locations include the abdomen. The rash is characterized by redness and itchiness. She was exposed to an insect bite/sting. Pertinent negatives include no congestion, cough, diarrhea, fever or shortness of breath. Past treatments include anti-itch cream. The treatment provided mild relief.      Review of Systems  Constitutional: Negative for fever.  HENT: Negative for congestion.   Respiratory: Negative for cough and shortness of breath.   Gastrointestinal: Negative for diarrhea.  Skin: Positive for rash.  All other systems reviewed and are negative.      Objective:   Physical Exam  Constitutional: She is oriented to person, place, and time. She appears well-developed and well-nourished. No distress.  HENT:  Head: Normocephalic and atraumatic.  Eyes: Pupils are equal, round, and reactive to light.  Neck: Normal range of motion. Neck supple. No thyromegaly present.  Cardiovascular: Normal rate, regular rhythm, normal heart sounds and intact distal pulses.  No murmur heard. Pulmonary/Chest: Effort normal and breath sounds normal. No respiratory distress. She has no wheezes.  Abdominal: Soft. Bowel sounds are normal. She exhibits no distension. There is no tenderness.  Musculoskeletal: Normal range of motion. She exhibits no edema or tenderness.  Neurological: She is alert and oriented to person, place, and time. She has normal reflexes. No cranial nerve deficit.  Skin: Skin is warm and dry. Rash noted. There is erythema.  4X5 cm erythemas circular rash on left lower abdomen   Psychiatric: She has a normal mood and affect. Her behavior is normal. Judgment and thought content normal.  Vitals  reviewed.     BP 118/81   Pulse 93   Temp 98.4 F (36.9 C) (Oral)   Ht 5' 3.6" (1.615 m)   Wt 173 lb (78.5 kg)   BMI 30.07 kg/m      Assessment & Plan:  Veronica Stuart was seen today for insect bite.  Diagnoses and all orders for this visit:  Insect bite of abdominal wall, initial encounter -     doxycycline (VIBRA-TABS) 100 MG tablet; Take 1 tablet (100 mg total) by mouth 2 (two) times daily.   Do not scratch  If redness worsens or does not improve call office   Ice   Evelina Dun, FNP

## 2018-11-07 ENCOUNTER — Other Ambulatory Visit: Payer: Self-pay | Admitting: Family

## 2018-11-07 DIAGNOSIS — E039 Hypothyroidism, unspecified: Secondary | ICD-10-CM

## 2018-12-09 ENCOUNTER — Other Ambulatory Visit: Payer: Self-pay | Admitting: Family

## 2018-12-09 DIAGNOSIS — E039 Hypothyroidism, unspecified: Secondary | ICD-10-CM

## 2018-12-10 NOTE — Telephone Encounter (Signed)
Last thyroid 11/05/17

## 2019-01-05 ENCOUNTER — Other Ambulatory Visit: Payer: Self-pay | Admitting: Family

## 2019-01-05 DIAGNOSIS — E039 Hypothyroidism, unspecified: Secondary | ICD-10-CM

## 2019-01-05 NOTE — Telephone Encounter (Signed)
[facility] NTBS 30 days given 12/10/18

## 2019-01-06 NOTE — Telephone Encounter (Signed)
Left message, need to schedule an appointment to continue getting refills.

## 2019-01-09 ENCOUNTER — Telehealth: Payer: Self-pay | Admitting: Family

## 2019-01-09 ENCOUNTER — Other Ambulatory Visit: Payer: Self-pay

## 2019-01-09 DIAGNOSIS — E039 Hypothyroidism, unspecified: Secondary | ICD-10-CM

## 2019-01-09 MED ORDER — LEVOTHYROXINE SODIUM 88 MCG PO TABS: ORAL_TABLET | ORAL | 0 refills | Status: DC

## 2019-01-09 NOTE — Telephone Encounter (Signed)
[facility] [place]  PT is wanting to know if we can send in the levothyroxine (SYNTHROID, LEVOTHROID) 88 MCG tablet she knows that she needs to be seen but due to the coronavirus she doesn't want to come but wants to know if Alyse Low will please send in some for her to pharmacy

## 2019-01-09 NOTE — Telephone Encounter (Signed)
Sent to Dr Building control surveyor for [employer]

## 2019-01-09 NOTE — Telephone Encounter (Signed)
Last seen 04/03/18  Last thyroid 11/05/17  Patient wants a refill due to not wanting to come in because of C. Virus   Christy PCP

## 2019-01-12 ENCOUNTER — Other Ambulatory Visit: Payer: Self-pay | Admitting: Obstetrics and Gynecology

## 2019-01-12 DIAGNOSIS — N9489 Other specified conditions associated with female genital organs and menstrual cycle: Secondary | ICD-10-CM

## 2019-01-12 NOTE — Telephone Encounter (Signed)
Informed patient that medication refill was sent in on Friday.

## 2019-01-12 NOTE — Telephone Encounter (Signed)
PT HAS CALLED BACK TO SEE IF SHE CAN GET A REFILL ON MEDICATION, CALLED Friday HASN'T HEARD ANYTHING BACK

## 2019-01-13 ENCOUNTER — Other Ambulatory Visit: Payer: Self-pay | Admitting: Family

## 2019-01-13 ENCOUNTER — Other Ambulatory Visit: Payer: Self-pay

## 2019-01-13 ENCOUNTER — Ambulatory Visit
Admission: RE | Admit: 2019-01-13 | Discharge: 2019-01-13 | Disposition: A | Discharge status: Home / Self Care | Payer: BC Managed Care – PPO | Source: Ambulatory Visit | Attending: Obstetrics and Gynecology | Admitting: Obstetrics and Gynecology

## 2019-01-13 DIAGNOSIS — E039 Hypothyroidism, unspecified: Secondary | ICD-10-CM

## 2019-01-13 DIAGNOSIS — N9489 Other specified conditions associated with female genital organs and menstrual cycle: Secondary | ICD-10-CM

## 2019-01-13 MED ORDER — GADOBENATE DIMEGLUMINE 529 MG/ML IV SOLN
19.0000 mL | Freq: Once | INTRAVENOUS | Status: AC | PRN
  Administered 2019-01-13: 19 mL via INTRAVENOUS

## 2019-01-14 ENCOUNTER — Telehealth: Payer: Self-pay

## 2019-01-14 NOTE — Telephone Encounter (Signed)
Incoming call from pt regarding she has been referred here and is requesting an appt.  Joylene John NP reviewed her faxed chart and appt given for Dr Skeet Latch per Lenna Sciara NP.  Directions given to [facility] and valet info and screening info given.  Pt denies any recent travel, symptoms, or being around anyone sick or that traveled.  No other needs per pt at this time.

## 2019-01-14 NOTE — Telephone Encounter (Signed)
Last thyroid level 11/05/17

## 2019-01-15 ENCOUNTER — Inpatient Hospital Stay: Payer: BC Managed Care – PPO

## 2019-01-15 ENCOUNTER — Encounter: Payer: Self-pay | Admitting: Oncology

## 2019-01-15 ENCOUNTER — Encounter: Payer: Self-pay | Admitting: Gynecologic Oncology

## 2019-01-15 ENCOUNTER — Other Ambulatory Visit: Payer: Self-pay

## 2019-01-15 ENCOUNTER — Inpatient Hospital Stay: Payer: BC Managed Care – PPO | Attending: Gynecologic Oncology | Admitting: Gynecologic Oncology

## 2019-01-15 VITALS — BP 125/86 | HR 78 | Temp 98.4°F | Resp 16 | Ht 63.0 in | Wt 193.4 lb

## 2019-01-15 DIAGNOSIS — C786 Secondary malignant neoplasm of retroperitoneum and peritoneum: Secondary | ICD-10-CM

## 2019-01-15 DIAGNOSIS — C579 Malignant neoplasm of female genital organ, unspecified: Secondary | ICD-10-CM

## 2019-01-15 DIAGNOSIS — C801 Malignant (primary) neoplasm, unspecified: Principal | ICD-10-CM

## 2019-01-15 DIAGNOSIS — R971 Elevated cancer antigen 125 [CA 125]: Secondary | ICD-10-CM | POA: Diagnosis not present

## 2019-01-15 DIAGNOSIS — C482 Malignant neoplasm of peritoneum, unspecified: Secondary | ICD-10-CM | POA: Diagnosis not present

## 2019-01-15 DIAGNOSIS — R970 Elevated carcinoembryonic antigen [CEA]: Secondary | ICD-10-CM | POA: Diagnosis not present

## 2019-01-15 LAB — CEA (IN HOUSE-CHCC): CEA (CHCC-In House): 8.03 ng/mL — ABNORMAL HIGH (ref 0.00–5.00)

## 2019-01-15 NOTE — Patient Instructions (Signed)
Dr. Skeet Latch feels you have probable Stage III ovarian cancer but we will need to confirm this with a biopsy. You will receive a phone call from the hospital to arrange for a biopsy of the omentum. We will also check a CEA level (tumor marker) today. We will also arrange for you to meet with Dr. Heath Lark, Medical Oncologist, to discuss chemotherapy.  Carboplatin injection What is this medicine? CARBOPLATIN (KAR boe pla tin) is a chemotherapy drug. It targets fast dividing cells, like cancer cells, and causes these cells to die. This medicine is used to treat ovarian cancer and many other cancers. This medicine may be used for other purposes; ask your health care provider or pharmacist if you have questions. COMMON BRAND NAME(S): Paraplatin What should I tell my health care provider before I take this medicine? They need to know if you have any of these conditions: -blood disorders -hearing problems -kidney disease -recent or ongoing radiation therapy -an unusual or allergic reaction to carboplatin, cisplatin, other chemotherapy, other medicines, foods, dyes, or preservatives -pregnant or trying to get pregnant -breast-feeding How should I use this medicine? This drug is usually given as an infusion into a vein. It is administered in a hospital or clinic by a specially trained health care professional. Talk to your pediatrician regarding the use of this medicine in children. Special care may be needed. Overdosage: If you think you have taken too much of this medicine contact a poison control center or emergency room at once. NOTE: This medicine is only for you. Do not share this medicine with others. What if I miss a dose? It is important not to miss a dose. Call your doctor or health care professional if you are unable to keep an appointment. What may interact with this medicine? -medicines for seizures -medicines to increase blood counts like filgrastim, pegfilgrastim, sargramostim -some  antibiotics like amikacin, gentamicin, neomycin, streptomycin, tobramycin -vaccines Talk to your doctor or health care professional before taking any of these medicines: -acetaminophen -aspirin -ibuprofen -ketoprofen -naproxen This list may not describe all possible interactions. Give your health care provider a list of all the medicines, herbs, non-prescription drugs, or dietary supplements you use. Also tell them if you smoke, drink alcohol, or use illegal drugs. Some items may interact with your medicine. What should I watch for while using this medicine? Your condition will be monitored carefully while you are receiving this medicine. You will need important blood work done while you are taking this medicine. This drug may make you feel generally unwell. This is not uncommon, as chemotherapy can affect healthy cells as well as cancer cells. Report any side effects. Continue your course of treatment even though you feel ill unless your doctor tells you to stop. In some cases, you may be given additional medicines to help with side effects. Follow all directions for their use. Call your doctor or health care professional for advice if you get a fever, chills or sore throat, or other symptoms of a cold or flu. Do not treat yourself. This drug decreases your body's ability to fight infections. Try to avoid being around people who are sick. This medicine may increase your risk to bruise or bleed. Call your doctor or health care professional if you notice any unusual bleeding. Be careful brushing and flossing your teeth or using a toothpick because you may get an infection or bleed more easily. If you have any dental work done, tell your dentist you are receiving this medicine. Avoid  taking products that contain aspirin, acetaminophen, ibuprofen, naproxen, or ketoprofen unless instructed by your doctor. These medicines may hide a fever. Do not become pregnant while taking this medicine. Women should  inform their doctor if they wish to become pregnant or think they might be pregnant. There is a potential for serious side effects to an unborn child. Talk to your health care professional or pharmacist for more information. Do not breast-feed an infant while taking this medicine. What side effects may I notice from receiving this medicine? Side effects that you should report to your doctor or health care professional as soon as possible: -allergic reactions like skin rash, itching or hives, swelling of the face, lips, or tongue -signs of infection - fever or chills, cough, sore throat, pain or difficulty passing urine -signs of decreased platelets or bleeding - bruising, pinpoint red spots on the skin, black, tarry stools, nosebleeds -signs of decreased red blood cells - unusually weak or tired, fainting spells, lightheadedness -breathing problems -changes in hearing -changes in vision -chest pain -high blood pressure -low blood counts - This drug may decrease the number of white blood cells, red blood cells and platelets. You may be at increased risk for infections and bleeding. -nausea and vomiting -pain, swelling, redness or irritation at the injection site -pain, tingling, numbness in the hands or feet -problems with balance, talking, walking -trouble passing urine or change in the amount of urine Side effects that usually do not require medical attention (report to your doctor or health care professional if they continue or are bothersome): -hair loss -loss of appetite -metallic taste in the mouth or changes in taste This list may not describe all possible side effects. Call your doctor for medical advice about side effects. You may report side effects to FDA at 1-800-FDA-1088. Where should I keep my medicine? This drug is given in a hospital or clinic and will not be stored at home. NOTE: This sheet is a summary. It may not cover all possible information. If you have questions about  this medicine, talk to your doctor, pharmacist, or health care provider.  2019 Elsevier/Gold Standard (2008-01-20 14:38:05)  Paclitaxel (taxol) injection What is this medicine? PACLITAXEL (PAK li TAX el) is a chemotherapy drug. It targets fast dividing cells, like cancer cells, and causes these cells to die. This medicine is used to treat ovarian cancer, breast cancer, lung cancer, Kaposi's sarcoma, and other cancers. This medicine may be used for other purposes; ask your health care provider or pharmacist if you have questions. COMMON BRAND NAME(S): Onxol, Taxol What should I tell my health care provider before I take this medicine? They need to know if you have any of these conditions: -history of irregular heartbeat -liver disease -low blood counts, like low white cell, platelet, or red cell counts -lung or breathing disease, like asthma -tingling of the fingers or toes, or other nerve disorder -an unusual or allergic reaction to paclitaxel, alcohol, polyoxyethylated castor oil, other chemotherapy, other medicines, foods, dyes, or preservatives -pregnant or trying to get pregnant -breast-feeding How should I use this medicine? This drug is given as an infusion into a vein. It is administered in a hospital or clinic by a specially trained health care professional. Talk to your pediatrician regarding the use of this medicine in children. Special care may be needed. Overdosage: If you think you have taken too much of this medicine contact a poison control center or emergency room at once. NOTE: This medicine is only for you.  Do not share this medicine with others. What if I miss a dose? It is important not to miss your dose. Call your doctor or health care professional if you are unable to keep an appointment. What may interact with this medicine? Do not take this medicine with any of the following medications: -disulfiram -metronidazole This medicine may also interact with the  following medications: -antiviral medicines for hepatitis, HIV or AIDS -certain antibiotics like erythromycin and clarithromycin -certain medicines for fungal infections like ketoconazole and itraconazole -certain medicines for seizures like carbamazepine, phenobarbital, phenytoin -gemfibrozil -nefazodone -rifampin -St. John's wort This list may not describe all possible interactions. Give your health care provider a list of all the medicines, herbs, non-prescription drugs, or dietary supplements you use. Also tell them if you smoke, drink alcohol, or use illegal drugs. Some items may interact with your medicine. What should I watch for while using this medicine? Your condition will be monitored carefully while you are receiving this medicine. You will need important blood work done while you are taking this medicine. This medicine can cause serious allergic reactions. To reduce your risk you will need to take other medicine(s) before treatment with this medicine. If you experience allergic reactions like skin rash, itching or hives, swelling of the face, lips, or tongue, tell your doctor or health care professional right away. In some cases, you may be given additional medicines to help with side effects. Follow all directions for their use. This drug may make you feel generally unwell. This is not uncommon, as chemotherapy can affect healthy cells as well as cancer cells. Report any side effects. Continue your course of treatment even though you feel ill unless your doctor tells you to stop. Call your doctor or health care professional for advice if you get a fever, chills or sore throat, or other symptoms of a cold or flu. Do not treat yourself. This drug decreases your body's ability to fight infections. Try to avoid being around people who are sick. This medicine may increase your risk to bruise or bleed. Call your doctor or health care professional if you notice any unusual bleeding. Be  careful brushing and flossing your teeth or using a toothpick because you may get an infection or bleed more easily. If you have any dental work done, tell your dentist you are receiving this medicine. Avoid taking products that contain aspirin, acetaminophen, ibuprofen, naproxen, or ketoprofen unless instructed by your doctor. These medicines may hide a fever. Do not become pregnant while taking this medicine. Women should inform their doctor if they wish to become pregnant or think they might be pregnant. There is a potential for serious side effects to an unborn child. Talk to your health care professional or pharmacist for more information. Do not breast-feed an infant while taking this medicine. Men are advised not to father a child while receiving this medicine. This product may contain alcohol. Ask your pharmacist or healthcare provider if this medicine contains alcohol. Be sure to tell all healthcare providers you are taking this medicine. Certain medicines, like metronidazole and disulfiram, can cause an unpleasant reaction when taken with alcohol. The reaction includes flushing, headache, nausea, vomiting, sweating, and increased thirst. The reaction can last from 30 minutes to several hours. What side effects may I notice from receiving this medicine? Side effects that you should report to your doctor or health care professional as soon as possible: -allergic reactions like skin rash, itching or hives, swelling of the face, lips, or tongue -  breathing problems -changes in vision -fast, irregular heartbeat -high or low blood pressure -mouth sores -pain, tingling, numbness in the hands or feet -signs of decreased platelets or bleeding - bruising, pinpoint red spots on the skin, black, tarry stools, blood in the urine -signs of decreased red blood cells - unusually weak or tired, feeling faint or lightheaded, falls -signs of infection - fever or chills, cough, sore throat, pain or difficulty  passing urine -signs and symptoms of liver injury like dark yellow or brown urine; general ill feeling or flu-like symptoms; light-colored stools; loss of appetite; nausea; right upper belly pain; unusually weak or tired; yellowing of the eyes or skin -swelling of the ankles, feet, hands -unusually slow heartbeat Side effects that usually do not require medical attention (report to your doctor or health care professional if they continue or are bothersome): -diarrhea -hair loss -loss of appetite -muscle or joint pain -nausea, vomiting -pain, redness, or irritation at site where injected -tiredness This list may not describe all possible side effects. Call your doctor for medical advice about side effects. You may report side effects to FDA at 1-800-FDA-1088. Where should I keep my medicine? This drug is given in a hospital or clinic and will not be stored at home. NOTE: This sheet is a summary. It may not cover all possible information. If you have questions about this medicine, talk to your doctor, pharmacist, or health care provider.  2019 Elsevier/Gold Standard (2017-06-18 13:14:55)

## 2019-01-15 NOTE — Progress Notes (Signed)
Consult Note: Gyn-Onc  Consult was requested by Dr.Taavon for the evaluation of Veronica Stuart 58 y.o. female  CC:  Chief Complaint  Patient presents with  . 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  A CEA was collected at this visit and returned at a value of 8.9.  She reports a colonoscopy approximately 10 years ago that is within normal limits.  Her family history is notable for a maternal grandmother and an uncle with colon cancer.  CT-guided biopsy of an intraperitoneal masses will be collected to confirm the histologic type.  If this is a GYN malignancy will proceed with neoadjuvant chemotherapy with Taxol and carboplatin with a plan for interval assessment and debulking.  Elevated CEA will need to be addressed, so we will make a referral for colonoscopy  A referral has been made to Heath Lark   HPI: [name] is a 58 y.o. 2 para 1 who has noted left lower quadrant pain with bowel movements or urination since January 2020.Marland Kitchen  However there is been no change in her bowel or bladder habits.  She denies any abdominal bloating, there is been no weight loss there is been no early satiety.  Pelvic ultrasound was obtained on January 09, 2019 and was notable for right adnexa with 'soft tissue slightly vascular mass' noted.  No ovarian tissue was imaged nor separate ovary noted. The endometrial stripe is 3.8 mm.  MRI was obtained on January 13, 2019 notable for no pathological enlarged lymph nodes in the pelvis no acute vascular abnormalities.  There was small volume pelvic ascites with extensive peritoneal thickening nodularity and enhancement throughout the pelvis and visualized changes were greater on the right than the left paracolic gutters.  Nodular implants were noted measuring up to 1.7 cm.  There was a large irregular soft tissue mass in the right lower  quadrant peritoneum measuring 4.4 x 2.9 cm with thick irregular enhancing wall.  There were numerous enhancing soft tissue implants throughout the lower mesentery measuring up to 1.7 cm.  A Ca1 25 was obtained and returned with a value of 40.  Veronica Stuart  history is notable for colonoscopy approximately 10 years ago this was within normal limits.  Mammography weeks ago was within normal limits.  Her family history is notable for a maternal grandmother with colon cancer diagnosed in her 69s, maternal aunt with colon cancer diagnosed in the 53s and a maternal aunt with non-Hodgkin's lymphoma maternal uncle with throat cancer and a niece with melanoma diagnosed at the age of 78.    Review of Systems:  Constitutional  Feels well, no weight loss cardiovascular  No chest pain, shortness of breath, or edema  Pulmonary  No cough or wheeze.  Gastro Intestinal  No nausea, vomitting, or diarrhoea.  No bloating no early satiety no bright red blood per rectum, no abdominal pain, change in bowel movement, or constipation.  Genito Urinary  No frequency, urgency, dysuria, reports discomfort with urination  musculo Skeletal  No myalgia, arthralgia, joint swelling or pain  Neurologic  No weakness, numbness, change in gait,  Psychology  No depression, anxiety, insomnia.    Current Meds:  Outpatient Encounter Medications as of 01/15/2019  Medication Sig  . acetaminophen (TYLENOL) 500 MG tablet Take 500 mg by mouth every 6 (six) hours as needed.    Marland Kitchen BIOTIN  PO Take by mouth daily.  . cetirizine (ZYRTEC) 10 MG tablet Take 10 mg by mouth daily.    . Cholecalciferol (VITAMIN D3) 2000 UNITS TABS Take 1 capsule by mouth every 3 (three) days.  Marland Kitchen co-enzyme Q-10 30 MG capsule Take 30 mg by mouth 3 (three) times daily.  . Cyanocobalamin (B-12) 100 MCG TABS Take by mouth.  . levothyroxine (SYNTHROID, LEVOTHROID) 88 MCG tablet TAKE 1 TABLET BY MOUTH EVERY DAY **NEED OFFICE VISIT**  . Misc Natural Products  (GLUCOS-CHONDROIT-MSM COMPLEX PO) Take by mouth.  Marland Kitchen atorvastatin (LIPITOR) 20 MG tablet Take 1 tablet (20 mg total) by mouth daily.  . [DISCONTINUED] doxycycline (VIBRA-TABS) 100 MG tablet Take 1 tablet (100 mg total) by mouth 2 (two) times daily.   No facility-administered encounter medications on file as of 01/15/2019.     Allergy:  Allergies  Allergen Reactions  . Celecoxib Other (See Comments)    REACTION: INCOHERENT "felt like was in a fog"  . Lisinopril Other (See Comments)    REACTION: unknown "joint pain"    Social Hx:   Social History   Socioeconomic History  . Marital status: Married    Spouse name: Not on file  . Number of children: Not on file  . Years of education: Not on file  . Highest education level: Not on file  Occupational History  . Not on file  Social Needs  . Financial resource strain: Not on file  . Food insecurity:    Worry: Not on file    Inability: Not on file  . Transportation needs:    Medical: Not on file    Non-medical: Not on file  Tobacco Use  . Smoking status: Never Smoker  . Smokeless tobacco: Never Used  . Tobacco comment: quit in 1978   Substance and Sexual Activity  . Alcohol use: No  . Drug use: No  . Sexual activity: Not on file  Lifestyle  . Physical activity:    Days per week: Not on file    Minutes per session: Not on file  . Stress: Not on file  Relationships  . Social connections:    Talks on phone: Not on file    Gets together: Not on file    Attends religious service: Not on file    Active member of club or organization: Not on file    Attends meetings of clubs or organizations: Not on file    Relationship status: Not on file  . Intimate partner violence:    Fear of current or ex partner: Not on file    Emotionally abused: Not on file    Physically abused: Not on file    Forced sexual activity: Not on file  Other Topics Concern  . Not on file  Social History Narrative   Full time - teacher.    Married.    Does not get regular exercise.     Past Surgical Hx: History reviewed. No pertinent surgical history.  Past Medical Hx:  Past Medical History:  Diagnosis Date  . Chest pain, unspecified    stress ech 04/20/10 - normal. EF normal (echo done 2/09- EF 65%)   . Dizziness   . Dyspnea   . HTN (hypertension)   . Hypothyroidism   . Palpitations   . Tendonitis of shoulder, right     Past Gynecological History:   Gravida 2 para 1 menopausal at age 8 no history of abnormal Pap tests no abnormal vaginal bleeding  Family Hx:  Family History  Problem Relation Age of Onset  . Diabetes Mother   . Hyperlipidemia Mother   . Hypertension Mother   . Cancer Other        several kinds on mothers side  . Diabetes Other        family hx  . Thyroid disease Other        family hx     Vitals:  Blood pressure 125/86, pulse 78, temperature 98.4 F (36.9 C), temperature source Oral, resp. rate 16, height 5\' 3"  (1.6 m), weight 193 lb 6 oz (87.7 kg), SpO2 99 %.  Physical Exam: WD in NAD Neck  Supple NROM, without any enlargements.  Lymph Node Survey No cervical supraclavicular or inguinal adenopathy Cardiovascular  Pulse normal rate, regularity and rhythm. S1 and S2 normal.  Lungs  Clear to auscultation bilaterally, without wheezes/crackles/rhonchi. Good air movement.  Skin  No rash/lesions/breakdown  Psychiatry  Alert and oriented appropriate mood affect speech and reasoning. Abdomen  Normoactive bowel sounds, abdomen soft, non-tender.  No evidence of hernia.  Back No CVA tenderness Genito Urinary  Vulva/vagina: Normal external female genitalia.  No lesions. No discharge or bleeding.  Bladder/urethra:  No lesions or masses  Vagina: Atrophic no masses no lesions no discharge or bleeding  Cervix: Normal appearing, no lesions.  Uterus: Mobile, no parametrial involvement or nodularity.  Adnexa: No palpable masses. Rectal  Good tone, no masses no cul de sac nodularity.  Extremities  No  bilateral cyanosis, clubbing or edema.   Janie Morning, MD, PhD 01/15/2019, 3:14 PM

## 2019-01-16 ENCOUNTER — Telehealth: Payer: Self-pay | Admitting: Oncology

## 2019-01-16 ENCOUNTER — Telehealth: Payer: Self-pay | Admitting: Gynecologic Oncology

## 2019-01-16 ENCOUNTER — Telehealth: Payer: Self-pay | Admitting: Internal Medicine

## 2019-01-16 ENCOUNTER — Other Ambulatory Visit: Payer: Self-pay | Admitting: Gynecologic Oncology

## 2019-01-16 DIAGNOSIS — C801 Malignant (primary) neoplasm, unspecified: Principal | ICD-10-CM

## 2019-01-16 DIAGNOSIS — C786 Secondary malignant neoplasm of retroperitoneum and peritoneum: Secondary | ICD-10-CM

## 2019-01-16 DIAGNOSIS — R970 Elevated carcinoembryonic antigen [CEA]: Secondary | ICD-10-CM

## 2019-01-16 NOTE — Telephone Encounter (Signed)
Consult scheduled on 3/24 at 10:00am with [facility].

## 2019-01-16 NOTE — Telephone Encounter (Signed)
Veronica Stuart called and asked if there is anything she can do to speed up the CT biopsy.  Advised her that it is being reviewed by the radiologist and then it will be scheduled.  She also asked if it is OK for her daughter to come home from college in May when she will be getting chemotherapy.  Advised her that it will be OK as long as she doesn't have symptoms (fever, cough, shortness of breath) or has not traveled from high risk areas.  She verbalized agreement.

## 2019-01-16 NOTE — Telephone Encounter (Signed)
Called [facility] to schedule urgent appointment for patient.  They said they will send the referral to Dr. Celesta Aver nurse to review and they will contact the patient with an appointment.

## 2019-01-16 NOTE — Progress Notes (Signed)
Called and informed patient of CEA results and need to have GI evaluation with colonoscopy poss endoscopy.  She is scheduled for biopsy of omentum on Wed and advised to still proceed with this.  Last colonoscopy was in 2011 with Dr. Carlean Purl. All questions answered.  Advised to call for any needs.

## 2019-01-16 NOTE — Telephone Encounter (Signed)
Hi Sheri, I received a call from oncology requesting pt to be seen asap. There is a referral in the workqueue for pt to be seen for Elevated CEA of 8.03, peritoneal carcinomatosis. Pt has seen Dr. Carlean Purl in 2011. Pls advise on scheduling.

## 2019-01-16 NOTE — Telephone Encounter (Signed)
Put on with him or APP for next week

## 2019-01-16 NOTE — Telephone Encounter (Signed)
Left message asking patient to call the office back.  Patient returned call.  See note attached to orders only encounter.

## 2019-01-17 ENCOUNTER — Other Ambulatory Visit: Payer: BC Managed Care – PPO

## 2019-01-19 ENCOUNTER — Telehealth: Payer: Self-pay

## 2019-01-19 NOTE — Telephone Encounter (Signed)
Patient was to be seen before being scheduled for a colonoscopy. She was referred to [place] due to an elevated CEA. She has ovarian cancer. She questions the need to be seen first.

## 2019-01-19 NOTE — Telephone Encounter (Signed)
She is having a biopsy of her omentum on 3/25 in IR  I am ccing Joylene John NP of GYN Oncology  We can do a colonoscopy but if the biopsies confirm ovarian cancer do we really need to do a colonoscopy for mild CEA elevation?  I would not bring her into the office until we can sort this out in the current Covid-19 pandemic situation

## 2019-01-19 NOTE — Telephone Encounter (Signed)
Left a message with this information.

## 2019-01-20 ENCOUNTER — Other Ambulatory Visit: Payer: Self-pay

## 2019-01-20 ENCOUNTER — Telehealth (INDEPENDENT_AMBULATORY_CARE_PROVIDER_SITE_OTHER): Payer: BC Managed Care – PPO | Admitting: Nurse Practitioner

## 2019-01-20 ENCOUNTER — Other Ambulatory Visit: Payer: Self-pay | Admitting: Radiology

## 2019-01-20 DIAGNOSIS — R970 Elevated carcinoembryonic antigen [CEA]: Secondary | ICD-10-CM

## 2019-01-20 DIAGNOSIS — C801 Malignant (primary) neoplasm, unspecified: Secondary | ICD-10-CM

## 2019-01-20 DIAGNOSIS — C786 Secondary malignant neoplasm of retroperitoneum and peritoneum: Secondary | ICD-10-CM

## 2019-01-20 NOTE — Progress Notes (Signed)
Telephonic visit  Patient has given consent for a telephonic visit today and understands that is the same as is required for any face-to-face patient encounter except that the service was provided via telephone.   At the time of this telephonic visit the patient was located at her home and I, the Provider, at the office of [facility].   Patient was referred to [facility] by Dr. Janie Morning  No one other than the patient and I participated in this telephonic visit today.   Total time of telephonic visit : 13 minutes.              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 CEA mildly elevated at 8.03 so referred for colonoscopy. Patient says CA-125 was just mildly elevated. She is for CT guided biopsy of an intraperitoneal mass tomorrow. No GI complaints. -We will schedule patient for colonoscopy to rule out colon neoplasm. The risks and benefits of colonoscopy with possible polypectomy / biopsies were discussed and the patient agrees to proceed.  -Colonoscopy scheduled for 01/30/2019.  Biopsy results of the intraperitoneal mass will probably be available before then. confirmed GYN malignancy then patient will let us know as colonoscopy can possibly be cancelled at that point. She is not due for screening colonoscopy until December 2021.    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   Veronica Stuart is a 58 year old female known to Dr. Carlean Purl from screening colonoscopy December 2011.  The exam was normal.  Patient is referred by Dr. Janie Morning ( [facility]) for elevated CEA.  In January patient began having LLQ pain with bowel movements / urination.  Pelvic U/S revealed right pelvic mass.  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  He has 4 biopsy of CEA was obtained and mildly elevated at 8.03.  Patient referred to [place] for consideration of colonoscopy  Veronica Stuart has not had any bowel changes, no blood in her stool.  The only GI symptom that she can possibly think of is some increased gas type pressure in her lower abdomen at times.  She has had no unexplained weight loss.  No nausea / vomiting.     Past Medical History:  Diagnosis Date   stress ech 04/20/10 - normal. EF normal (echo done 2/09- EF 65%)   . Dizziness   . HTN (hypertension)   . Hypothyroidism   . Palpitations   . Tendonitis of shoulder, right     No past surgical history on file. Family History  Problem Relation Age of Onset  . Diabetes Mother   . Hyperlipidemia Mother   . Hypertension Mother   . Cancer Other        several kinds on mothers side  . Diabetes Other        family hx  . Thyroid disease Other        family hx    Social History   Tobacco Use  . Smoking status: Never Smoker  . Smokeless tobacco: Never Used  . Tobacco comment: quit in 1978   Substance Use Topics  . Alcohol use: No  . Drug use: No   Current Outpatient Medications  Medication Sig  Dispense Refill  . acetaminophen (TYLENOL) 500 MG tablet Take 500 mg by mouth every 6 (six) hours as needed.      Marland Kitchen BIOTIN PO Take by mouth daily.    . cetirizine (ZYRTEC) 10 MG tablet Take 10 mg by mouth daily.      . Cholecalciferol (VITAMIN D3) 2000 UNITS TABS Take 1 capsule by mouth every 3 (three) days.    Marland Kitchen co-enzyme Q-10 30 MG capsule Take 30 mg by mouth 3 (three) times daily.    . Cyanocobalamin (B-12) 100 MCG TABS Take by mouth.    . levothyroxine (SYNTHROID, LEVOTHROID) 88 MCG tablet TAKE 1 TABLET BY MOUTH EVERY DAY **NEED OFFICE VISIT** 30 tablet 0  . Misc Natural Products (GLUCOS-CHONDROIT-MSM COMPLEX PO) Take by mouth.    Marland Kitchen atorvastatin (LIPITOR) 20 MG tablet Take 1 tablet (20 mg total)  by mouth daily. 90 tablet 1   No current facility-administered medications for this visit.    Allergies  Allergen Reactions  . Celecoxib Other (See Comments)    REACTION: INCOHERENT "felt like was in a fog"  . Lisinopril Other (See Comments)    REACTION: unknown "joint pain"    Physical Exam:    N/A   Tye Savoy, NP  01/20/2019, 1:32 PM   Cc:  Janie Morning, MD

## 2019-01-20 NOTE — Patient Instructions (Signed)
If you are age 60 or older, your body mass index should be between 23-30. Your There is no height or weight on file to calculate BMI. If this is out of the aforementioned range listed, please consider follow up with your Primary Care Provider.  If you are age 10 or younger, your body mass index should be between 19-25. Your There is no height or weight on file to calculate BMI. If this is out of the aformentioned range listed, please consider follow up with your Primary Care Provider.   You have been scheduled for a colonoscopy. Please follow written instructions given to you at your visit today.  Please pick up your prep supplies at the pharmacy within the next 1-3 days. If you use inhalers (even only as needed), please bring them with you on the day of your procedure. Your physician has requested that you go to www.startemmi.com and enter the access code given to you at your visit today. This web site gives a general overview about your procedure. However, you should still follow specific instructions given to you by our office regarding your preparation for the procedure.  Thank you for choosing me and [facility].   Tye Savoy, NP

## 2019-01-21 ENCOUNTER — Other Ambulatory Visit: Payer: Self-pay

## 2019-01-21 ENCOUNTER — Ambulatory Visit (HOSPITAL_COMMUNITY)
Admission: RE | Admit: 2019-01-21 | Discharge: 2019-01-21 | Disposition: A | Discharge status: Home / Self Care | Payer: BC Managed Care – PPO | Source: Ambulatory Visit | Attending: Gynecologic Oncology | Admitting: Gynecologic Oncology

## 2019-01-21 ENCOUNTER — Telehealth: Payer: Self-pay | Admitting: Oncology

## 2019-01-21 ENCOUNTER — Encounter (HOSPITAL_COMMUNITY): Payer: Self-pay

## 2019-01-21 DIAGNOSIS — Z8249 Family history of ischemic heart disease and other diseases of the circulatory system: Secondary | ICD-10-CM | POA: Insufficient documentation

## 2019-01-21 DIAGNOSIS — E039 Hypothyroidism, unspecified: Secondary | ICD-10-CM | POA: Insufficient documentation

## 2019-01-21 DIAGNOSIS — R188 Other ascites: Secondary | ICD-10-CM | POA: Diagnosis not present

## 2019-01-21 DIAGNOSIS — Z809 Family history of malignant neoplasm, unspecified: Secondary | ICD-10-CM | POA: Diagnosis not present

## 2019-01-21 DIAGNOSIS — Z888 Allergy status to other drugs, medicaments and biological substances status: Secondary | ICD-10-CM | POA: Insufficient documentation

## 2019-01-21 DIAGNOSIS — I10 Essential (primary) hypertension: Secondary | ICD-10-CM | POA: Insufficient documentation

## 2019-01-21 DIAGNOSIS — Z79890 Hormone replacement therapy (postmenopausal): Secondary | ICD-10-CM | POA: Insufficient documentation

## 2019-01-21 DIAGNOSIS — C801 Malignant (primary) neoplasm, unspecified: Secondary | ICD-10-CM | POA: Insufficient documentation

## 2019-01-21 DIAGNOSIS — Z8349 Family history of other endocrine, nutritional and metabolic diseases: Secondary | ICD-10-CM | POA: Insufficient documentation

## 2019-01-21 DIAGNOSIS — Z79899 Other long term (current) drug therapy: Secondary | ICD-10-CM | POA: Diagnosis not present

## 2019-01-21 DIAGNOSIS — C786 Secondary malignant neoplasm of retroperitoneum and peritoneum: Secondary | ICD-10-CM | POA: Insufficient documentation

## 2019-01-21 DIAGNOSIS — R971 Elevated cancer antigen 125 [CA 125]: Secondary | ICD-10-CM | POA: Insufficient documentation

## 2019-01-21 DIAGNOSIS — Z833 Family history of diabetes mellitus: Secondary | ICD-10-CM | POA: Diagnosis not present

## 2019-01-21 LAB — COMPREHENSIVE METABOLIC PANEL
ALT: 22 U/L (ref 0–44)
AST: 24 U/L (ref 15–41)
Albumin: 3.8 g/dL (ref 3.5–5.0)
Alkaline Phosphatase: 93 U/L (ref 38–126)
Anion gap: 10 (ref 5–15)
BUN: 14 mg/dL (ref 6–20)
CO2: 23 mmol/L (ref 22–32)
Calcium: 8.7 mg/dL — ABNORMAL LOW (ref 8.9–10.3)
Chloride: 109 mmol/L (ref 98–111)
Creatinine, Ser: 0.82 mg/dL (ref 0.44–1.00)
GFR calc Af Amer: >60 mL/min (ref >60)
GFR calc non Af Amer: >60 mL/min (ref >60)
Glucose, Bld: 104 mg/dL — ABNORMAL HIGH (ref 70–99)
Potassium: 4 mmol/L (ref 3.5–5.1)
Sodium: 142 mmol/L (ref 135–145)
Total Bilirubin: 0.6 mg/dL (ref 0.3–1.2)
Total Protein: 6.7 g/dL (ref 6.5–8.1)

## 2019-01-21 LAB — CBC WITH DIFFERENTIAL/PLATELET
Abs Immature Granulocytes: 0.02 10*3/uL (ref 0.00–0.07)
Basophils Absolute: 0.1 10*3/uL (ref 0.0–0.1)
Basophils Relative: 1 %
Eosinophils Absolute: 0.2 10*3/uL (ref 0.0–0.5)
Eosinophils Relative: 3 %
HCT: 44.7 % (ref 36.0–46.0)
Hemoglobin: 14.7 g/dL (ref 12.0–15.0)
IMMATURE GRANULOCYTES: 0 %
Lymphocytes Relative: 15 %
Lymphs Abs: 1.3 10*3/uL (ref 0.7–4.0)
MCH: 28.1 pg (ref 26.0–34.0)
MCHC: 32.9 g/dL (ref 30.0–36.0)
MCV: 85.5 fL (ref 80.0–100.0)
Monocytes Absolute: 0.6 10*3/uL (ref 0.1–1.0)
Monocytes Relative: 7 %
Neutro Abs: 6.8 10*3/uL (ref 1.7–7.7)
Neutrophils Relative %: 74 %
Platelets: 272 10*3/uL (ref 150–400)
RBC: 5.23 MIL/uL — ABNORMAL HIGH (ref 3.87–5.11)
RDW: 12.5 % (ref 11.5–15.5)
WBC: 9.1 10*3/uL (ref 4.0–10.5)
nRBC: 0 % (ref 0.0–0.2)

## 2019-01-21 LAB — PROTIME-INR
INR: 0.9 (ref 0.8–1.2)
Prothrombin Time: 12.2 seconds (ref 11.4–15.2)

## 2019-01-21 MED ORDER — SODIUM CHLORIDE 0.9 % IV SOLN
INTRAVENOUS | Status: DC
  Administered 2019-01-21: 08:00:00 via INTRAVENOUS

## 2019-01-21 MED ORDER — MIDAZOLAM HCL 2 MG/2ML IJ SOLN
INTRAMUSCULAR | Status: AC
  Filled 2019-01-21: qty 4

## 2019-01-21 MED ORDER — MIDAZOLAM HCL 2 MG/2ML IJ SOLN
INTRAMUSCULAR | Status: AC | PRN
  Administered 2019-01-21 (×4): 1 mg via INTRAVENOUS

## 2019-01-21 MED ORDER — LIDOCAINE HCL (PF) 1 % IJ SOLN
INTRAMUSCULAR | Status: AC | PRN
  Administered 2019-01-21: 10 mL

## 2019-01-21 MED ORDER — FENTANYL CITRATE (PF) 100 MCG/2ML IJ SOLN
INTRAMUSCULAR | Status: AC
  Filled 2019-01-21: qty 2

## 2019-01-21 MED ORDER — NALOXONE HCL 0.4 MG/ML IJ SOLN
INTRAMUSCULAR | Status: AC
  Filled 2019-01-21: qty 1

## 2019-01-21 MED ORDER — FENTANYL CITRATE (PF) 100 MCG/2ML IJ SOLN
INTRAMUSCULAR | Status: AC | PRN
  Administered 2019-01-21 (×2): 50 ug via INTRAVENOUS

## 2019-01-21 MED ORDER — HYDROCODONE-ACETAMINOPHEN 5-325 MG PO TABS: 1.0000 | ORAL_TABLET | ORAL | Status: DC | PRN

## 2019-01-21 MED ORDER — FLUMAZENIL 0.5 MG/5ML IV SOLN
INTRAVENOUS | Status: AC
  Filled 2019-01-21: qty 5

## 2019-01-21 NOTE — Procedures (Signed)
Interventional Radiology Procedure:   Indications: Peritoneal carcinomatosis  Procedure: CT guided omental biopsy  Findings: Omental caking in LLQ, 3 cores obtained.  Complications: None     EBL: Less than 5 ml  Plan: Bedrest 2 hours, then discharge to home.     Joban Colledge R. Anselm Pancoast, MD  Pager: [phone]

## 2019-01-21 NOTE — Telephone Encounter (Signed)
Requested a rush on pathology from US biopsy with Suanne Marker with [facility].

## 2019-01-21 NOTE — Consult Note (Signed)
Chief Complaint: Patient was seen in consultation today for image guided biopsy of omental caking   Referring Physician(s): Brewster,W  Supervising Physician: Markus Daft  Patient Status: [facility] - Out-pt  History of Present Illness: Veronica Stuart is a 58 y.o. female with history of intermittent lower abdominal/back discomfort along with mildly elevated CA 125 and CEA.  Recent imaging has revealed evidence of widespread peritoneal carcinomatosis with diffuse omental soft tissue caking and numerous soft tissue implants throughout the lower mesentery, small volume pelvic ascites, and right ovarian mass.  She has no prior history of malignancy.  She presents today for image guided omental caking biopsy for further evaluation.  Past Medical History:  Diagnosis Date   Chest pain, unspecified    stress ech 04/20/10 - normal. EF normal (echo done 2/09- EF 65%)    Dizziness    Dyspnea    HTN (hypertension)    Hypothyroidism    Palpitations    Tendonitis of shoulder, right     History reviewed. No pertinent surgical history.  Allergies: Celecoxib and Lisinopril  Medications: Prior to Admission medications   Medication Sig Start Date End Date Taking? Authorizing Provider  acetaminophen (TYLENOL) 500 MG tablet Take 500 mg by mouth every 6 (six) hours as needed.     Yes [provider]  BIOTIN PO Take by mouth daily.   Yes [provider]  cetirizine (ZYRTEC) 10 MG tablet Take 10 mg by mouth daily.     Yes [provider]  Cholecalciferol (VITAMIN D3) 2000 UNITS TABS Take 1 capsule by mouth every 3 (three) days.   Yes [provider]  co-enzyme Q-10 30 MG capsule Take 30 mg by mouth 3 (three) times daily.   Yes [provider]  Cyanocobalamin (B-12) 100 MCG TABS Take by mouth.   Yes [provider]  levothyroxine (SYNTHROID, LEVOTHROID) 88 MCG tablet TAKE 1 TABLET BY MOUTH EVERY DAY **NEED OFFICE VISIT** 01/15/19  Yes Hawks,  Theador Hawthorne, FNP  Misc Natural Products (GLUCOS-CHONDROIT-MSM COMPLEX PO) Take by mouth.   Yes [provider]  atorvastatin (LIPITOR) 20 MG tablet Take 1 tablet (20 mg total) by mouth daily. 11/06/17 11/06/18  Sharion Balloon, FNP     Family History  Problem Relation Age of Onset   Diabetes Mother    Hyperlipidemia Mother    Hypertension Mother    Cancer Other        several kinds on mothers side   Diabetes Other        family hx   Thyroid disease Other        family hx     Social History   Socioeconomic History   Marital status: Married    Spouse name: Not on file   Number of children: Not on file   Years of education: Not on file   Highest education level: Not on file  Occupational History   Not on file  Social Needs   Financial resource strain: Not on file   Food insecurity:    Worry: Not on file    Inability: Not on file   Transportation needs:    Medical: Not on file    Non-medical: Not on file  Tobacco Use   Smoking status: Never Smoker   Smokeless tobacco: Never Used   Tobacco comment: quit in 1978   Substance and Sexual Activity   Alcohol use: No   Drug use: No   Sexual activity: Not on file  Lifestyle  Physical activity:    Days per week: Not on file    Minutes per session: Not on file   Stress: Not on file  Relationships   Social connections:    Talks on phone: Not on file    Gets together: Not on file    Attends religious service: Not on file    Active member of club or organization: Not on file    Attends meetings of clubs or organizations: Not on file    Relationship status: Not on file  Other Topics Concern   Not on file  Social History Narrative   Full time - Pharmacist, hospital.    Married.   Does not get regular exercise.       Review of Systems see above; denies fever, headache, chest pain, dyspnea, cough, nausea, vomiting or bleeding  Vital Signs: BP (!) 151/107    Pulse 90    Temp 98.2 F (36.8 C) (Oral)     Resp 18    SpO2 98%   Physical Exam awake, alert.  Chest clear to auscultation bilaterally.  Heart with regular rate and rhythm.  Abdomen  soft, positive bowel sounds, nontender.  No lower extremity edema.  Imaging: Mr Pelvis W Wo Contrast  Result Date: 01/14/2019 CLINICAL DATA:  58 year old female with reported right adnexal mass on outside office sonogram. Elevated CA 125. EXAM: MRI PELVIS WITHOUT AND WITH CONTRAST TECHNIQUE: Multiplanar multisequence MR imaging of the pelvis was performed both before and after administration of intravenous contrast. CONTRAST:  39mL MULTIHANCE GADOBENATE DIMEGLUMINE 529 MG/ML IV SOLN COMPARISON:  None available at the time of this dictation. FINDINGS: Urinary Tract:  Normal bladder.  Normal urethra. Bowel: Normal caliber visualized small and large bowel loops. No definite bowel wall thickening. Vascular/Lymphatic: No pathologically enlarged lymph nodes in the pelvis. No acute vascular abnormality. Reproductive: Uterus: The anteverted uterus measures 6.6 x 3.4 x 4.8 cm. There are two intramural uterine fibroids measuring 2.5 x 2.2 x 2.1 cm in left fundal uterus and 1.8 x 1.6 x 1.4 cm in the right fundal uterus. Inner myometrium (junctional zone) measures 2 mm in thickness, which is within normal limits. Endometrium measures 3 mm in bilayer thickness, which is within normal limits. No endometrial cavity fluid or focal endometrial mass. Ovaries and Adnexa: The right ovary is replaced by a 3.8 x 3.2 x 2.6 cm mildly T2 hyperintense mass with heterogeneous thick peripheral enhancement (series 4/image 17). The left ovary is not clearly visualized. Other: There is small volume pelvic ascites. There is extensive peritoneal thickening, nodularity and enhancement throughout the pelvis and visualized right greater than left lower paracolic gutters. For example an enhancing 1.2 cm nodular implant in the rectouterine space (series 9/image 91) and a 1.7 cm nodular implant in left deep  pelvic peritoneal reflection (series 9/image 86). There is partially visualized diffuse soft tissue caking of the omentum (series 2/image 30). There is a large irregular soft tissue mass in the right lower quadrant peritoneum measuring 4.4 x 2.9 cm (series 9/image 44) with thick irregular enhancing wall. There are numerous enhancing nodular soft tissue implants throughout the visualized lower mesentery measuring up to 1.7 cm (series 9/image 2). No focal pelvic fluid collection. Musculoskeletal: Marked degenerative disc disease in the visualized lower lumbar spine. Heterogeneous marrow signal intensity without focal osseous lesion. IMPRESSION: 1. Evidence of widespread peritoneal carcinomatosis. Diffuse omental soft tissue caking. Diffuse peritoneal thickening, nodularity and enhancement throughout the pelvis and paracolic gutters. Numerous soft tissue implants throughout the visualized lower  mesentery, with dominant 4.4 cm right lower quadrant peritoneal implant. 2. Small volume malignant pelvic ascites. 3. 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 4. Myomatous uterus as detailed.  No endometrial abnormality. 5. No pelvic adenopathy. Electronically Signed   By: Ilona Sorrel M.D.   On: 01/14/2019 09:12    Labs:  CBC: Recent Labs    01/21/19 0730  WBC 9.1  HGB 14.7  HCT 44.7  PLT 272    COAGS: Recent Labs    01/21/19 0730  INR 0.9    BMP: No results for input(s): NA, K, CL, CO2, GLUCOSE, BUN, CALCIUM, CREATININE, GFRNONAA, GFRAA in the last 8760 hours.  Invalid input(s): CMP  LIVER FUNCTION TESTS: No results for input(s): BILITOT, AST, ALT, ALKPHOS, PROT, ALBUMIN in the last 8760 hours.  TUMOR MARKERS: No results for input(s): AFPTM, CEA, CA199, CHROMGRNA in the last 8760 hours.  Assessment and Plan: 58 y.o. female with history of intermittent lower abdominal/back discomfort along with mildly  elevated CA 125 and CEA.  Recent imaging has revealed evidence of widespread peritoneal carcinomatosis with diffuse omental soft tissue caking and numerous soft tissue implants throughout the lower mesentery, small volume pelvic ascites, and right ovarian mass.  She has no prior history of malignancy.  She presents today for image guided omental caking biopsy for further evaluation.Risks and benefits of procedure was discussed with the patient  including, but not limited to bleeding, infection, damage to adjacent structures or low yield requiring additional tests.  All of the questions were answered and there is agreement to proceed.  Consent signed and in chart.     Thank you for this interesting consult.  I greatly enjoyed meeting Veronica Stuart and look forward to participating in their care.  A copy of this report was sent to the requesting provider on this date.  Electronically Signed: D. Rowe Robert, PA-C 01/21/2019, 8:22 AM   I spent a total of  20 minutes   in face to face in clinical consultation, greater than 50% of which was counseling/coordinating care for image guided omental caking biopsy

## 2019-01-21 NOTE — Discharge Instructions (Addendum)
Moderate Conscious Sedation, Adult, Care After °These instructions provide you with information about caring for yourself after your procedure. Your health care provider may also give you more specific instructions. Your treatment has been planned according to current medical practices, but problems sometimes occur. Call your health care provider if you have any problems or questions after your procedure. °What can I expect after the procedure? °After your procedure, it is common: °· To feel sleepy for several hours. °· To feel clumsy and have poor balance for several hours. °· To have poor judgment for several hours. °· To vomit if you eat too soon. °Follow these instructions at home: °For at least 24 hours after the procedure: ° °· Do not: °? Participate in activities where you could fall or become injured. °? Drive. °? Use heavy machinery. °? Drink alcohol. °? Take sleeping pills or medicines that cause drowsiness. °? Make important decisions or sign legal documents. °? Take care of children on your own. °· Rest. °Eating and drinking °· Follow the diet recommended by your health care provider. °· If you vomit: °? Drink water, juice, or soup when you can drink without vomiting. °? Make sure you have little or no nausea before eating solid foods. °General instructions °· Have a responsible adult stay with you until you are awake and alert. °· Take over-the-counter and prescription medicines only as told by your health care provider. °· If you smoke, do not smoke without supervision. °· Keep all follow-up visits as told by your health care provider. This is important. °Contact a health care provider if: °· You keep feeling nauseous or you keep vomiting. °· You feel light-headed. °· You develop a rash. °· You have a fever. °Get help right away if: °· You have trouble breathing. °This information is not intended to replace advice given to you by your health care provider. Make sure you discuss any questions you have  with your health care provider. °Document Released: 08/05/2013 Document Revised: 03/19/2016 Document Reviewed: 02/04/2016 °Elsevier Interactive Patient Education © 2019 [employer] ° ° °Needle Biopsy, Care After °These instructions tell you how to care for yourself after your procedure. Your doctor may also give you more specific instructions. Call your doctor if you have any problems or questions. °What can I expect after the procedure? °After the procedure, it is common to have: °· Soreness. °· Bruising. °· Mild pain. °Follow these instructions at home: ° °· Return to your normal activities as told by your doctor. Ask your doctor what activities are safe for you. °· Take over-the-counter and prescription medicines only as told by your doctor. °· Wash your hands with soap and water before you change your bandage (dressing). If you cannot use soap and water, use hand sanitizer. °· Follow instructions from your doctor about: °? How to take care of your puncture site. °? When and how to change your bandage. °? When to remove your bandage.  You may remove your dressing tomorrow. °· Check your puncture site every day for signs of infection. Watch for: °? Redness, swelling, or pain. °? Fluid or blood.  °? Pus or a bad smell. °? Warmth. °· Do not take baths, swim, or use a hot tub until your doctor approves. Ask your doctor if you may take showers. You may only be allowed to take sponge baths.  You may shower tomorrow. °· Keep all follow-up visits as told by your doctor. This is important. °Contact a doctor if you have: °· A fever. °·   Redness, swelling, or pain at the puncture site, and it lasts longer than a few days. °· Fluid, blood, or pus coming from the puncture site. °· Warmth coming from the puncture site. °Get help right away if: °· You have a lot of bleeding from the puncture site. °Summary °· After the procedure, it is common to have soreness, bruising, or mild pain at the puncture site. °· Check your puncture  site every day for signs of infection, such as redness, swelling, or pain. °· Get help right away if you have severe bleeding from your puncture site. °This information is not intended to replace advice given to you by your health care provider. Make sure you discuss any questions you have with your health care provider. °Document Released: 09/27/2008 Document Revised: 10/28/2017 Document Reviewed: 10/28/2017 °Elsevier Interactive Patient Education © 2019 Elsevier Inc[employer] ° °

## 2019-01-23 ENCOUNTER — Telehealth: Payer: Self-pay | Admitting: *Deleted

## 2019-01-23 ENCOUNTER — Telehealth: Payer: Self-pay | Admitting: Nurse Practitioner

## 2019-01-23 ENCOUNTER — Other Ambulatory Visit: Payer: Self-pay | Admitting: Gynecologic Oncology

## 2019-01-23 DIAGNOSIS — C786 Secondary malignant neoplasm of retroperitoneum and peritoneum: Secondary | ICD-10-CM

## 2019-01-23 DIAGNOSIS — C801 Malignant (primary) neoplasm, unspecified: Secondary | ICD-10-CM

## 2019-01-23 DIAGNOSIS — C799 Secondary malignant neoplasm of unspecified site: Secondary | ICD-10-CM

## 2019-01-23 NOTE — Telephone Encounter (Signed)
A new patient appt has been scheduled for the pt to see Ned Card, NP/Dr. Benay Spice on 4/1 at 10:45am. Letter mailed.

## 2019-01-23 NOTE — Telephone Encounter (Signed)
Called and scheduled the patient's CT scan per [facility] APP. Called and spoke with the patient regarding the CT scan. Gave the patient the date/time and instructions.

## 2019-01-23 NOTE — Progress Notes (Signed)
CT CAP ordered.  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  Dr. Carlean Purl and Dr. Benay Spice aware.  CT CAP to evaluate the upper abdomen and chest for evidence of disease, to evaluate for primary site, and to complete staging scans.

## 2019-01-26 ENCOUNTER — Telehealth: Payer: Self-pay | Admitting: *Deleted

## 2019-01-26 ENCOUNTER — Other Ambulatory Visit: Payer: Self-pay | Admitting: Gynecologic Oncology

## 2019-01-26 DIAGNOSIS — R112 Nausea with vomiting, unspecified: Secondary | ICD-10-CM

## 2019-01-26 DIAGNOSIS — G893 Neoplasm related pain (acute) (chronic): Secondary | ICD-10-CM

## 2019-01-26 MED ORDER — ONDANSETRON HCL 4 MG PO TABS: 4.0000 mg | ORAL_TABLET | Freq: Three times a day (TID) | ORAL | 0 refills | Status: DC | PRN

## 2019-01-26 MED ORDER — TRAMADOL HCL 50 MG PO TABS: 50.0000 mg | ORAL_TABLET | Freq: Four times a day (QID) | ORAL | 0 refills | Status: DC | PRN

## 2019-01-26 NOTE — Telephone Encounter (Signed)
Patient called and left a message that stating "I am having stomach pain and vomiting bile. I wanted to see about getting an earlier appt with Dr. Benay Spice." Message forwarded to [facility] APP

## 2019-01-26 NOTE — Progress Notes (Signed)
See RN note.  Patient called this am with abdominal pain and nausea/vomiting.  Sent message to Ned Card, NP for Dr. Benay Spice about patient requesting a sooner appt.  Zofran and tramadol sent in.  Patient did not want strong pain medication.

## 2019-01-26 NOTE — Telephone Encounter (Signed)
Ms Belgarde states that she vomited today at 0200 and 0800.  It was bile that came up. No projectile vomiting. The pain level in abdomen was 10/10 while vomiting. She is currently sitting on the edge of the bed.  Her pain is a 4/10.  Pain is achy and throbbing. She took ibuprofen  With minimal effect. She is able to keep ginger ale down. Encouraged her to push fluids ~8-10 ounces every 2 hours.  Stay with liquids/full liquids with nausea/vomiting right now. Melissa Cross to send in tramadol and zofran to [facility] for pain and nausea.   Ms Pandey can come to the office after 2 pm today or any time tomorrow to be seen by Dr. Gearldine Shown team if possible. Pt with CT Scan on  Thursday 01-29-19 at 11:30.  Discussed using water based contrast with nausea. Ms Glantz will call Gyn Oncology by the end of the day to let them know if she wants to set up water based contrast as she would need to be in radiology by 0915 to begin drinking contrast. Pt to have Colonoscopy on Friday 01-30-19.

## 2019-01-26 NOTE — Progress Notes (Addendum)
New Hematology/Oncology Consult   Requesting MD: Dr. Janie Morning  [phone]    Reason for Consult: Metastatic adenocarcinoma  HPI:   Veronica Stuart is a 58 year old woman who recently presented to her gynecologist with complaint of abdominal pressure.  She initially noted abdominal pressure in late January 2020.  She sought evaluation in March.  She reports an abdominal ultrasound was abnormal and CA125 elevated.  MRI pelvis on 01/13/2019 showed diffuse omental soft tissue caking, diffuse peritoneal thickening, nodularity and enhancement throughout the pelvis and paracolic gutters.  There were numerous soft tissue implants throughout the visualized lower mesentery with a dominant 4.4 cm right lower quadrant peritoneal implant.  Small volume pelvic ascites noted.  There was a 3.8 cm right ovarian mass.  She was seen by Dr. Skeet Latch on 01/15/2019 and was referred for peritoneal biopsy.  On 01/21/2019 she underwent CT-guided omental/peritoneal biopsy.  Pathology showed metastatic adenocarcinoma, positive for CDX-2, cytokeratin 20 and cytokeratin 7.  Tumor negative for GATA-3, NapsinA, PAX 8, TTF-1, WT-1 and ER.  She has been referred for CT scans and a colonoscopy.  Several weeks ago she had an episode of abdominal pain.  This resolved over the course of 1 day.  About a week ago she noted a change in bowel habits with small frequent stools.  She had increased abdominal pain and vomiting yesterday.  She is taking Zofran and tramadol as needed and notes improvement.  She reports a history of heartburn which lately has been "more intense".     Past Medical History:  Diagnosis Date  . Chest pain, unspecified    stress ech 04/20/10 - normal. EF normal (echo done 2/09- EF 65%)   . Dizziness   . Dyspnea   . HTN (hypertension)   . Hypothyroidism   . Palpitations   . Tendonitis of shoulder, right   :  History reviewed. No pertinent surgical history.:   Current Outpatient Medications:  .   acetaminophen (TYLENOL) 500 MG tablet, Take 500 mg by mouth every 6 (six) hours as needed.  , Disp: , Rfl:  .  BIOTIN PO, Take by mouth every other day. , Disp: , Rfl:  .  cetirizine (ZYRTEC) 10 MG tablet, Take 10 mg by mouth daily.  , Disp: , Rfl:  .  Cholecalciferol (VITAMIN D3) 2000 UNITS TABS, Take 1 capsule by mouth every 3 (three) days., Disp: , Rfl:  .  co-enzyme Q-10 30 MG capsule, Take 30 mg by mouth every other day. , Disp: , Rfl:  .  Cyanocobalamin (B-12) 1000 MCG CAPS, Take 1,000 mcg by mouth every other day. , Disp: , Rfl:  .  levothyroxine (SYNTHROID, LEVOTHROID) 88 MCG tablet, TAKE 1 TABLET BY MOUTH EVERY DAY **NEED OFFICE VISIT**, Disp: 30 tablet, Rfl: 0 .  Misc Natural Products (GLUCOS-CHONDROIT-MSM COMPLEX PO), Take by mouth daily. + Biosil and another joint replacement supplement, Disp: , Rfl:  .  ondansetron (ZOFRAN) 4 MG tablet, Take 1 tablet (4 mg total) by mouth every 8 (eight) hours as needed for nausea or vomiting., Disp: 20 tablet, Rfl: 0 .  Pyridoxine HCl (VITAMIN B-6 PO), Take 1 tablet by mouth every other day., Disp: , Rfl:  .  traMADol (ULTRAM) 50 MG tablet, Take 1 tablet (50 mg total) by mouth every 6 (six) hours as needed. Do not take and drive, Disp: 15 tablet, Rfl: 0:  :   Allergies  Allergen Reactions  . Celecoxib Other (See Comments)    REACTION: INCOHERENT "felt like was in  a fog"  . Lisinopril Other (See Comments)    REACTION: unknown "joint pain"  :  FH: Mother deceased with Alzheimer's; father deceased accidental drowning; brother deceased with hemochromatosis; sister with hypothyroidism, polyps; brother in good health; maternal grandmother deceased with colon cancer; maternal aunt with liver/colon cancer; maternal aunt lymphoma; maternal uncle throat cancer  SOCIAL HISTORY: She lives in [place].  She is married.  She has a daughter reported to be in good health.  She teaches special education.  No tobacco use.  Rare alcohol  intake.  Review of Systems: No loss of appetite.  No weight loss.  No fever.  She had some sweats and chills yesterday.  No bleeding.  She denies dysphagia.  Nausea and abdominal pain yesterday.  She noted change in bowel habits about a week ago with frequent small stools.  No bloody or black stools. She reports a history of heartburn which has been more intense lately.  No shortness of breath.  No cough.  No hematuria or dysuria.  Physical Exam:  Blood pressure (!) 149/94, pulse 77, temperature 97.9 F (36.6 C), temperature source Oral, resp. rate 19, height 5\' 3"  (1.6 m), weight 192 lb 8 oz (87.3 kg), SpO2 100 %.  HEENT: Neck without mass. Lungs: Lungs clear bilaterally. Cardiac: Regular rate and rhythm. Abdomen: Abdomen is soft, nontender.  No hepatomegaly.  No mass.  No apparent ascites.  Vascular: No leg edema. Lymph nodes: No palpable cervical, supraclavicular, axillary or inguinal lymph nodes. Neurologic: Alert and oriented.  LABS: 01/15/2019 CEA 8.03  RADIOLOGY:  Mr Pelvis W Wo Contrast  Result Date: 01/14/2019 CLINICAL DATA:  58 year old female with reported right adnexal mass on outside office sonogram. Elevated CA 125. EXAM: MRI PELVIS WITHOUT AND WITH CONTRAST TECHNIQUE: Multiplanar multisequence MR imaging of the pelvis was performed both before and after administration of intravenous contrast. CONTRAST:  40mL MULTIHANCE GADOBENATE DIMEGLUMINE 529 MG/ML IV SOLN COMPARISON:  None available at the time of this dictation. FINDINGS: Urinary Tract:  Normal bladder.  Normal urethra. Bowel: Normal caliber visualized small and large bowel loops. No definite bowel wall thickening. Vascular/Lymphatic: No pathologically enlarged lymph nodes in the pelvis. No acute vascular abnormality. Reproductive: Uterus: The anteverted uterus measures 6.6 x 3.4 x 4.8 cm. There are two intramural uterine fibroids measuring 2.5 x 2.2 x 2.1 cm in left fundal uterus and 1.8 x 1.6 x 1.4 cm in the right fundal  uterus. Inner myometrium (junctional zone) measures 2 mm in thickness, which is within normal limits. Endometrium measures 3 mm in bilayer thickness, which is within normal limits. No endometrial cavity fluid or focal endometrial mass. Ovaries and Adnexa: The right ovary is replaced by a 3.8 x 3.2 x 2.6 cm mildly T2 hyperintense mass with heterogeneous thick peripheral enhancement (series 4/image 17). The left ovary is not clearly visualized. Other: There is small volume pelvic ascites. There is extensive peritoneal thickening, nodularity and enhancement throughout the pelvis and visualized right greater than left lower paracolic gutters. For example an enhancing 1.2 cm nodular implant in the rectouterine space (series 9/image 91) and a 1.7 cm nodular implant in left deep pelvic peritoneal reflection (series 9/image 86). There is partially visualized diffuse soft tissue caking of the omentum (series 2/image 30). There is a large irregular soft tissue mass in the right lower quadrant peritoneum measuring 4.4 x 2.9 cm (series 9/image 44) with thick irregular enhancing wall. There are numerous enhancing nodular soft tissue implants throughout the visualized lower mesentery measuring  up to 1.7 cm (series 9/image 2). No focal pelvic fluid collection. Musculoskeletal: Marked degenerative disc disease in the visualized lower lumbar spine. Heterogeneous marrow signal intensity without focal osseous lesion. IMPRESSION: 1. Evidence of widespread peritoneal carcinomatosis. Diffuse omental soft tissue caking. Diffuse peritoneal thickening, nodularity and enhancement throughout the pelvis and paracolic gutters. Numerous soft tissue implants throughout the visualized lower mesentery, with dominant 4.4 cm right lower quadrant peritoneal implant. 2. Small volume malignant pelvic ascites. 3. 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 4.  Myomatous uterus as detailed.  No endometrial abnormality. 5. No pelvic adenopathy. Electronically Signed   By: Ilona Sorrel M.D.   On: 01/14/2019 09:12   Ct Biopsy  Result Date: 01/21/2019 INDICATION: 58 year old with peritoneal carcinomatosis and needs a tissue diagnosis. Concern for primary ovarian cancer. EXAM: CT-GUIDED OMENTAL/PERITONEAL BIOPSY MEDICATIONS: None. ANESTHESIA/SEDATION: Moderate (conscious) sedation was employed during this procedure. A total of Versed 4.0 mg and Fentanyl 100 mcg was administered intravenously. Moderate Sedation Time: 20 minutes. The patient's level of consciousness and vital signs were monitored continuously by radiology nursing throughout the procedure under my direct supervision. FLUOROSCOPY TIME:  None COMPLICATIONS: None immediate. PROCEDURE: Informed written consent was obtained from the patient after a thorough discussion of the procedural risks, benefits and alternatives. All questions were addressed. A timeout was performed prior to the initiation of the procedure. Patient was placed supine and CT images through the lower abdomen were obtained. Omental caking in the left lower quadrant was targeted for biopsy. The left lower abdomen was prepped with chlorhexidine and sterile field was created. Skin and soft tissues were anesthetized with 1% lidocaine. A 17 gauge coaxial needle was directed into the left lower quadrant omental thickening. Needle position was confirmed within the omental thickening. Three core biopsies were obtained with an 18 gauge core device. Adequate specimens were obtained and placed in formalin. FINDINGS: Extensive nodularity throughout the peritoneum with focal omental thickening or caking in the mid and left lower abdomen. Needle position was confirmed within the omental thickening. Three adequate core biopsies were obtained. IMPRESSION: Successful CT-guided core biopsy of the omental thickening in the left lower abdomen. Electronically Signed    By: Markus Daft M.D.   On: 01/21/2019 10:10    Assessment and Plan:   1. Metastatic adenocarcinoma with carcinomatosis  01/09/2019 ultrasound- soft tissue slightly vascular mass noted in the right adnexa measuring 3.1 x 2.5 x 2.5 cm  01/09/2019- CA125 40.6   MRI pelvis 01/13/2019-diffuse omental soft tissue caking; diffuse peritoneal thickening, nodularity and enhancement throughout the pelvis and paracolic gutters.  Numerous soft tissue implants throughout the visualized lower mesentery with a dominant 4.4 cm right lower quadrant peritoneal implant.  Small volume pelvic ascites.  3.8 cm right ovarian mass.  01/15/2019-CEA 8  01/21/2019- CT biopsy of omental thickening left lower abdomen; pathology showed metastatic adenocarcinoma, positive for CDX-2, cytokeratin 20 and cytokeratin 7.  Tumor negative for GATA-3, NapsinA, PAX 8, TTF-1, WT-1 and ER. 2. Intermittent nausea/vomiting, abdominal pain-question obstructive symptoms 3. Hypothyroidism  Veronica Stuart has been diagnosed with metastatic adenocarcinoma with carcinomatosis.  The primary site is unknown but is felt to likely be gastrointestinal.  She is scheduled for staging CT scans later this week.  She has been referred for a colonoscopy.    We had discussion regarding treatment on the FOLFOX regimen.  We reviewed potential toxicities associated with chemotherapy including bone marrow toxicity, nausea, hair loss, allergic  reaction.  We reviewed the various forms of neuropathy associated with oxaliplatin including cold sensitivity, acute laryngopharyngeal dysesthesia, peripheral neuropathy and more rare occurrences such as diplopia, ataxia, incontinence.  We discussed potential toxicities associated with 5-fluorouracil including mouth sores, diarrhea, hand-foot syndrome, increased sensitivity to sun, skin hyperpigmentation.  She will attend a chemotherapy education class.  We are referring her for placement of a Port-A-Cath.  Prescriptions will be  sent to her pharmacy for Compazine and EMLA cream.  She is having periodic nausea/vomiting and abdominal pain.  We are concerned for intermittent obstruction  She will begin a laxative regimen and a low residue diet.  She will return for a follow-up visit on 02/03/2019.  She will contact the office in the interim with any problems.  Patient seen with Dr. Benay Spice.  45 minutes were spent face-to-face at today's visit with the majority of that time involved in counseling/coordination of care.    Ned Card, NP 01/27/2019, 3:44 PM   This was a shared visit with Ned Card.  Veronica Stuart was interviewed and examined.  We reviewed the pelvic MRI images with her.  She has been diagnosed with metastatic adenocarcinoma involving the omentum.  She appears to have abdominal carcinomatosis.  We discussed the differential diagnosis and treatment options.  I suspect a primary colorectal, appendiceal, or upper gastrointestinal malignancy.  The pathology does not suggest a GYN primary.  She is scheduled for restaging CTs later this week.  The plan is to proceed with a colonoscopy if the CTs do not reveal an obvious primary tumor site.  She understands the metastatic nature of this tumor.  No therapy will be curative.  I recommend first-line treatment with FOLFOX.  We will consider additional treatment options based on molecular testing data.  We reviewed potential toxicities associated with the FOLFOX regimen and she attended a chemotherapy teaching class today.  I entered a chemotherapy plan into the electronic record.  She will begin a bowel regimen.  Julieanne Manson, MD

## 2019-01-27 ENCOUNTER — Inpatient Hospital Stay: Payer: BC Managed Care – PPO

## 2019-01-27 ENCOUNTER — Encounter: Payer: Self-pay | Admitting: Oncology

## 2019-01-27 ENCOUNTER — Other Ambulatory Visit: Payer: Self-pay

## 2019-01-27 ENCOUNTER — Telehealth: Payer: Self-pay

## 2019-01-27 ENCOUNTER — Inpatient Hospital Stay (HOSPITAL_BASED_OUTPATIENT_CLINIC_OR_DEPARTMENT_OTHER): Payer: BC Managed Care – PPO | Admitting: Oncology

## 2019-01-27 ENCOUNTER — Telehealth: Payer: Self-pay | Admitting: Internal Medicine

## 2019-01-27 VITALS — BP 149/94 | HR 77 | Temp 97.9°F | Resp 19 | Ht 63.0 in | Wt 192.5 lb

## 2019-01-27 DIAGNOSIS — E039 Hypothyroidism, unspecified: Secondary | ICD-10-CM | POA: Diagnosis not present

## 2019-01-27 DIAGNOSIS — R194 Change in bowel habit: Secondary | ICD-10-CM

## 2019-01-27 DIAGNOSIS — Z800 Family history of malignant neoplasm of digestive organs: Secondary | ICD-10-CM

## 2019-01-27 DIAGNOSIS — R109 Unspecified abdominal pain: Secondary | ICD-10-CM

## 2019-01-27 DIAGNOSIS — C799 Secondary malignant neoplasm of unspecified site: Secondary | ICD-10-CM | POA: Insufficient documentation

## 2019-01-27 DIAGNOSIS — C786 Secondary malignant neoplasm of retroperitoneum and peritoneum: Secondary | ICD-10-CM | POA: Diagnosis not present

## 2019-01-27 DIAGNOSIS — C800 Disseminated malignant neoplasm, unspecified: Secondary | ICD-10-CM

## 2019-01-27 DIAGNOSIS — Z820 Family history of epilepsy and other diseases of the nervous system: Secondary | ICD-10-CM

## 2019-01-27 DIAGNOSIS — R110 Nausea: Secondary | ICD-10-CM

## 2019-01-27 DIAGNOSIS — Z808 Family history of malignant neoplasm of other organs or systems: Secondary | ICD-10-CM

## 2019-01-27 DIAGNOSIS — Z7189 Other specified counseling: Secondary | ICD-10-CM

## 2019-01-27 MED ORDER — LIDOCAINE-PRILOCAINE 2.5-2.5 % EX CREA: TOPICAL_CREAM | CUTANEOUS | 2 refills | Status: AC

## 2019-01-27 MED ORDER — PROCHLORPERAZINE MALEATE 10 MG PO TABS: 10.0000 mg | ORAL_TABLET | Freq: Four times a day (QID) | ORAL | 2 refills | Status: AC | PRN

## 2019-01-27 NOTE — Progress Notes (Signed)
START OFF PATHWAY REGIMEN - [Other Dx]   [id number]:FOLFOX (q14d):   A cycle is every 14 days:     Oxaliplatin      Leucovorin      5-Fluorouracil      5-Fluorouracil   **Always confirm dose/schedule in your pharmacy ordering system**  Patient Characteristics: Intent of Therapy: Non-Curative / Palliative Intent, Discussed with Patient 

## 2019-01-27 NOTE — Telephone Encounter (Signed)
Per Ned Card NP pathology called today for foundation 1 testing on biopsy done on 01/21/19.

## 2019-01-27 NOTE — Telephone Encounter (Signed)
Manuela Schwartz called on behalf of Dr.Brad Benay Spice and our mutual pt. Manuela Schwartz stated that Dr.Sherrill will like to speak with Dr.Gessner about rescheduling the pt colon as urgent and can be paged at [phone]

## 2019-01-28 ENCOUNTER — Telehealth: Payer: Self-pay | Admitting: *Deleted

## 2019-01-28 ENCOUNTER — Ambulatory Visit: Payer: BC Managed Care – PPO | Admitting: Nurse Practitioner

## 2019-01-28 ENCOUNTER — Telehealth: Payer: Self-pay | Admitting: Oncology

## 2019-01-28 NOTE — Telephone Encounter (Signed)
Asking if Dr. Benay Spice spoke with Dr. Carlean Purl yet regarding the colonoscopy. She is due to start the clear liquid prep tomorrow if it is still being done Friday. Informed her that Dr. Carlean Purl told Dr. Benay Spice that he wants to see the CT results before ordering the colonoscopy and patient notified that he should have the results within 1-2 hours of the scan completion. Suggested she proceed with the clear liquid prep, but don't take any of the oral med prep till we know the date of the colonoscopy.

## 2019-01-28 NOTE — Telephone Encounter (Signed)
Called and scheduled appt per 3/31 los.  Patient aware of appt date and time.

## 2019-01-29 ENCOUNTER — Other Ambulatory Visit: Payer: Self-pay | Admitting: *Deleted

## 2019-01-29 ENCOUNTER — Telehealth: Payer: Self-pay | Admitting: *Deleted

## 2019-01-29 ENCOUNTER — Other Ambulatory Visit: Payer: Self-pay

## 2019-01-29 ENCOUNTER — Ambulatory Visit (HOSPITAL_COMMUNITY)
Admission: RE | Admit: 2019-01-29 | Discharge: 2019-01-29 | Disposition: A | Discharge status: Home / Self Care | Payer: BC Managed Care – PPO | Source: Ambulatory Visit | Attending: Gynecologic Oncology | Admitting: Gynecologic Oncology

## 2019-01-29 DIAGNOSIS — C801 Malignant (primary) neoplasm, unspecified: Secondary | ICD-10-CM | POA: Insufficient documentation

## 2019-01-29 DIAGNOSIS — C786 Secondary malignant neoplasm of retroperitoneum and peritoneum: Secondary | ICD-10-CM

## 2019-01-29 DIAGNOSIS — C799 Secondary malignant neoplasm of unspecified site: Secondary | ICD-10-CM | POA: Diagnosis present

## 2019-01-29 MED ORDER — SODIUM CHLORIDE (PF) 0.9 % IJ SOLN
INTRAMUSCULAR | Status: AC
  Filled 2019-01-29: qty 50

## 2019-01-29 MED ORDER — IOHEXOL 300 MG/ML  SOLN
100.0000 mL | Freq: Once | INTRAMUSCULAR | Status: AC | PRN
  Administered 2019-01-29: 100 mL via INTRAVENOUS

## 2019-01-29 NOTE — Telephone Encounter (Signed)
Informed her that Dr. Carlean Purl and Dr. Benay Spice reviewed her CT results. A dominant mass is in cecum, thus she does not need to have a colonoscopy per Dr. Carlean Purl. Patient was notified and will resume her low residue diet. She is requesting MD call her with further explanation of scan.

## 2019-01-29 NOTE — Telephone Encounter (Signed)
Patient left VM reporting no BM in several days and has had some intermittent abdominal discomfort. Continues the low residue diet as instructed. Should she begin stool softener and drink some of her colonoscopy prep since she has mixed it? Per Ned Card, NP: Take Miralax 17 grams daily and Colace 100 mg bid. Do not drink the prep--may be too much. Patient reports the prep per Dr. Carlean Purl was bottle of Miralax mixed in 64 ounces of liquid (does not use [place]). She will drink 8 ounces of the mixture over the weekend.

## 2019-01-30 ENCOUNTER — Telehealth: Payer: Self-pay

## 2019-01-30 ENCOUNTER — Other Ambulatory Visit: Payer: Self-pay | Admitting: Physician Assistant

## 2019-01-30 ENCOUNTER — Encounter: Payer: BC Managed Care – PPO | Admitting: Internal Medicine

## 2019-01-30 NOTE — Telephone Encounter (Signed)
Called Veronica Stuart to introduce myself and role of navigator. I reviewed treatment team resources for support and questions. We reviewed what to expect day of treatment, PAC care, use of nausea medications, symptom management strategies and the need to reach out if she is has questions, concerns or  unmanageable symptoms. Will follow as needed.

## 2019-01-30 NOTE — Telephone Encounter (Signed)
Called [facility] to introduce myself and role of navigator. I reviewed treatment team resources for support and questions. We reviewed what to expect day of treatment, PAC care, use of nausea medications, symptom management strategies and the need to reach out if she is has questions, concerns or  unmanageable symptoms.Will follow as needed.

## 2019-02-01 ENCOUNTER — Other Ambulatory Visit: Payer: Self-pay | Admitting: Oncology

## 2019-02-02 ENCOUNTER — Ambulatory Visit (HOSPITAL_COMMUNITY)
Admission: RE | Admit: 2019-02-02 | Discharge: 2019-02-02 | Disposition: A | Discharge status: Home / Self Care | Payer: BC Managed Care – PPO | Source: Ambulatory Visit | Attending: Nurse Practitioner | Admitting: Nurse Practitioner

## 2019-02-02 ENCOUNTER — Encounter: Payer: Self-pay | Admitting: *Deleted

## 2019-02-02 ENCOUNTER — Encounter (HOSPITAL_COMMUNITY): Payer: Self-pay

## 2019-02-02 ENCOUNTER — Ambulatory Visit (HOSPITAL_COMMUNITY)
Admission: RE | Admit: 2019-02-02 | Discharge: 2019-02-02 | Disposition: A | Discharge status: Home / Self Care | Payer: BC Managed Care – PPO | Source: Ambulatory Visit | Attending: Oncology | Admitting: Oncology

## 2019-02-02 ENCOUNTER — Other Ambulatory Visit: Payer: Self-pay

## 2019-02-02 ENCOUNTER — Other Ambulatory Visit: Payer: Self-pay | Admitting: Nurse Practitioner

## 2019-02-02 DIAGNOSIS — E039 Hypothyroidism, unspecified: Secondary | ICD-10-CM | POA: Diagnosis not present

## 2019-02-02 DIAGNOSIS — Z79899 Other long term (current) drug therapy: Secondary | ICD-10-CM | POA: Diagnosis not present

## 2019-02-02 DIAGNOSIS — C800 Disseminated malignant neoplasm, unspecified: Secondary | ICD-10-CM | POA: Insufficient documentation

## 2019-02-02 DIAGNOSIS — Z79890 Hormone replacement therapy (postmenopausal): Secondary | ICD-10-CM | POA: Insufficient documentation

## 2019-02-02 DIAGNOSIS — I10 Essential (primary) hypertension: Secondary | ICD-10-CM | POA: Diagnosis not present

## 2019-02-02 DIAGNOSIS — Z888 Allergy status to other drugs, medicaments and biological substances status: Secondary | ICD-10-CM | POA: Insufficient documentation

## 2019-02-02 HISTORY — PX: IR IMAGING GUIDED PORT INSERTION: IMG5740

## 2019-02-02 LAB — CBC
HCT: 41.9 % (ref 36.0–46.0)
Hemoglobin: 13.9 g/dL (ref 12.0–15.0)
MCH: 28.1 pg (ref 26.0–34.0)
MCHC: 33.2 g/dL (ref 30.0–36.0)
MCV: 84.8 fL (ref 80.0–100.0)
Platelets: 273 10*3/uL (ref 150–400)
RBC: 4.94 MIL/uL (ref 3.87–5.11)
RDW: 12.4 % (ref 11.5–15.5)
WBC: 7.3 10*3/uL (ref 4.0–10.5)
nRBC: 0 % (ref 0.0–0.2)

## 2019-02-02 LAB — PROTIME-INR
INR: 1 (ref 0.8–1.2)
Prothrombin Time: 13.4 seconds (ref 11.4–15.2)

## 2019-02-02 MED ORDER — FENTANYL CITRATE (PF) 100 MCG/2ML IJ SOLN
INTRAMUSCULAR | Status: AC
  Filled 2019-02-02: qty 4

## 2019-02-02 MED ORDER — CEFAZOLIN SODIUM-DEXTROSE 2-4 GM/100ML-% IV SOLN
INTRAVENOUS | Status: AC
  Filled 2019-02-02: qty 100

## 2019-02-02 MED ORDER — HEPARIN SOD (PORK) LOCK FLUSH 100 UNIT/ML IV SOLN
INTRAVENOUS | Status: AC
  Filled 2019-02-02: qty 5

## 2019-02-02 MED ORDER — FENTANYL CITRATE (PF) 100 MCG/2ML IJ SOLN
INTRAMUSCULAR | Status: AC | PRN
  Administered 2019-02-02: 50 ug via INTRAVENOUS
  Administered 2019-02-02 (×2): 25 ug via INTRAVENOUS

## 2019-02-02 MED ORDER — CEFAZOLIN SODIUM-DEXTROSE 2-4 GM/100ML-% IV SOLN
2.0000 g | Freq: Once | INTRAVENOUS | Status: AC
  Administered 2019-02-02: 09:00:00 2 g via INTRAVENOUS

## 2019-02-02 MED ORDER — MIDAZOLAM HCL 2 MG/2ML IJ SOLN
INTRAMUSCULAR | Status: AC | PRN
  Administered 2019-02-02: 0.5 mg via INTRAVENOUS
  Administered 2019-02-02: 1 mg via INTRAVENOUS
  Administered 2019-02-02: 0.5 mg via INTRAVENOUS

## 2019-02-02 MED ORDER — MIDAZOLAM HCL 2 MG/2ML IJ SOLN
INTRAMUSCULAR | Status: AC
  Filled 2019-02-02: qty 4

## 2019-02-02 MED ORDER — SODIUM CHLORIDE 0.9 % IV SOLN
INTRAVENOUS | Status: DC
  Administered 2019-02-02 (×2): via INTRAVENOUS

## 2019-02-02 MED ORDER — LIDOCAINE HCL 1 % IJ SOLN
INTRAMUSCULAR | Status: AC | PRN
  Administered 2019-02-02: 10 mL

## 2019-02-02 MED ORDER — LIDOCAINE HCL 1 % IJ SOLN
INTRAMUSCULAR | Status: AC
  Filled 2019-02-02: qty 20

## 2019-02-02 MED ORDER — HEPARIN SOD (PORK) LOCK FLUSH 100 UNIT/ML IV SOLN
INTRAVENOUS | Status: AC | PRN
  Administered 2019-02-02: 500 [IU]

## 2019-02-02 NOTE — Discharge Instructions (Signed)
Implanted Port Insertion, Care After This sheet gives you information about how to care for yourself after your procedure. Your health care provider may also give you more specific instructions. If you have problems or questions, contact your health care provider. What can I expect after the procedure? After the procedure, it is common to have:  Discomfort at the port insertion site.  Bruising on the skin over the port. This should improve over 3-4 days. Follow these instructions at home: [facility] The care  After your port is placed, you will get a manufacturer's information card. The card has information about your port. Keep this card with you at all times.  Take care of the port as told by your health care provider. Ask your health care provider if you or a family member can get training for taking care of the port at home. A home health care nurse may also take care of the port.  Make sure to remember what type of port you have.  Incision care   Follow instructions from your health care provider about how to take care of your port insertion site. Make sure you: ? Wash your hands with soap and water before and after you change your bandage (dressing). If soap and water are not available, use hand sanitizer. ? Change your dressing as told by your health care provider. ? Leave stitches (sutures), skin glue, or adhesive strips in place. These skin closures may need to stay in place for 2 weeks or longer. If adhesive strip edges start to loosen and curl up, you may trim the loose edges. Do not remove adhesive strips completely unless your health care provider tells you to do that.  Check your port insertion site every day for signs of infection. Check for: ? Redness, swelling, or pain. ? Fluid or blood. ? Warmth. ? Pus or a bad smell. Activity  Return to your normal activities as told by your health care provider. Ask your health care provider what activities are safe for you.  Do not lift  anything that is heavier than 10 lb (4.5 kg), or the limit that you are told, until your health care provider says that it is safe. General instructions  Take over-the-counter and prescription medicines only as told by your health care provider.  Do not get dressing wet. Keep dry and intact for at least 24 hrs. Do not take baths, swim, or use a hot tub. You may take off dressing and take a shower in 24hrs.  Do not use EMLA cream for the next 2 weeks.   Do not drive for 24 hours if you were given a sedative during your procedure.  Wear a medical alert bracelet in case of an emergency. This will tell any health care providers that you have a port.  Keep all follow-up visits as told by your health care provider. This is important. Contact a health care provider if:  You cannot flush your port with saline as directed, or you cannot draw blood from the port.  You have a fever or chills.  You have redness, swelling, or pain around your port insertion site.  You have fluid or blood coming from your port insertion site.  Your port insertion site feels warm to the touch.  You have pus or a bad smell coming from the port insertion site. Get help right away if:  You have chest pain or shortness of breath.  You have bleeding from your port that you cannot control. Summary  Take care of the port as told by your health care provider. Keep the manufacturer's information card with you at all times.  Change your dressing as told by your health care provider.  Contact a health care provider if you have a fever or chills or if you have redness, swelling, or pain around your port insertion site.  Keep all follow-up visits as told by your health care provider. This information is not intended to replace advice given to you by your health care provider. Make sure you discuss any questions you have with your health care provider. Document Released: 08/05/2013 Document Revised: 05/13/2018 Document  Reviewed: 05/13/2018 Elsevier Interactive Patient Education  2019 [place].  Moderate Conscious Sedation, Adult, Care After These instructions provide you with information about caring for yourself after your procedure. Your health care provider may also give you more specific instructions. Your treatment has been planned according to current medical practices, but problems sometimes occur. Call your health care provider if you have any problems or questions after your procedure. What can I expect after the procedure? After your procedure, it is common:  To feel sleepy for several hours.  To feel clumsy and have poor balance for several hours.  To have poor judgment for several hours.  To vomit if you eat too soon. Follow these instructions at home: For at least 24 hours after the procedure:   Do not: ? Participate in activities where you could fall or become injured. ? Drive. ? Use heavy machinery. ? Drink alcohol. ? Take sleeping pills or medicines that cause drowsiness. ? Make important decisions or sign legal documents. ? Take care of children on your own.  Rest. Eating and drinking  Follow the diet recommended by your health care provider.  If you vomit: ? Drink water, juice, or soup when you can drink without vomiting. ? Make sure you have little or no nausea before eating solid foods. General instructions  Have a responsible adult stay with you until you are awake and alert.  Take over-the-counter and prescription medicines only as told by your health care provider.  If you smoke, do not smoke without supervision.  Keep all follow-up visits as told by your health care provider. This is important. Contact a health care provider if:  You keep feeling nauseous or you keep vomiting.  You feel light-headed.  You develop a rash.  You have a fever. Get help right away if:  You have trouble breathing. This information is not intended to replace advice  given to you by your health care provider. Make sure you discuss any questions you have with your health care provider. Document Released: 08/05/2013 Document Revised: 03/19/2016 Document Reviewed: 02/04/2016 Elsevier Interactive Patient Education  2019 [employer].

## 2019-02-02 NOTE — H&P (Signed)
Referring Physician(s): Sherrill,B  Supervising Physician: Aletta Edouard  Patient Status:  [facility]    Chief Complaint: "I'm getting a port"   Subjective: Patient familiar to IR service from recent omental biopsy on 01/21/2019.  She has a history of metastatic adenocarcinoma involving the omentum as well as poor venous access and presents again today for Port-A-Cath placement for palliative chemotherapy.  She currently denies fever, headache, chest pain, dyspnea, cough, abdominal/back pain, nausea, vomiting or bleeding.  Past Medical History:  Diagnosis Date   Chest pain, unspecified    stress ech 04/20/10 - normal. EF normal (echo done 2/09- EF 65%)    Dizziness    Dyspnea    HTN (hypertension)    Hypothyroidism    Palpitations    Tendonitis of shoulder, right    History reviewed. No pertinent surgical history.    Allergies: Celecoxib and Lisinopril  Medications: Prior to Admission medications   Medication Sig Start Date End Date Taking? Authorizing Provider  acetaminophen (TYLENOL) 500 MG tablet Take 500 mg by mouth every 6 (six) hours as needed.     Yes [provider]  BIOTIN PO Take by mouth every other day.    Yes [provider]  cetirizine (ZYRTEC) 10 MG tablet Take 10 mg by mouth daily.     Yes [provider]  Cholecalciferol (VITAMIN D3) 2000 UNITS TABS Take 1 capsule by mouth every 3 (three) days.   Yes [provider]  co-enzyme Q-10 30 MG capsule Take 30 mg by mouth every other day.    Yes [provider]  Cyanocobalamin (B-12) 1000 MCG CAPS Take 1,000 mcg by mouth every other day.    Yes [provider]  docusate sodium (COLACE) 100 MG capsule Take 100 mg by mouth 2 (two) times daily.   Yes [provider]  levothyroxine (SYNTHROID, LEVOTHROID) 88 MCG tablet TAKE 1 TABLET BY MOUTH EVERY DAY **NEED OFFICE VISIT** 01/15/19  Yes Hawks, Theador Hawthorne, FNP  Misc Natural Products  (GLUCOS-CHONDROIT-MSM COMPLEX PO) Take by mouth daily. + Biosil and another joint replacement supplement   Yes [provider]  ondansetron (ZOFRAN) 4 MG tablet Take 1 tablet (4 mg total) by mouth every 8 (eight) hours as needed for nausea or vomiting. 01/26/19  Yes Cross, Melissa D, NP  polyethylene glycol powder (GLYCOLAX/MIRALAX) powder Take 1 Container by mouth daily.   Yes [provider]  Pyridoxine HCl (VITAMIN B-6 PO) Take 1 tablet by mouth every other day.   Yes [provider]  traMADol (ULTRAM) 50 MG tablet Take 1 tablet (50 mg total) by mouth every 6 (six) hours as needed. Do not take and drive 2/42/35  Yes Cross, Melissa D, NP  lidocaine-prilocaine (EMLA) cream Apply to Port-A-Cath site 1 to 2 hours prior to use 01/27/19   Owens Shark, NP  prochlorperazine (COMPAZINE) 10 MG tablet Take 1 tablet (10 mg total) by mouth every 6 (six) hours as needed for nausea or vomiting. 01/27/19   Owens Shark, NP     Vital Signs: Blood pressure 150/95, heart rate 80, temperature 98.3, respirations 16, O2 sat 100% room air Ht 5\' 3"  (1.6 m)    Wt 192 lb 8 oz (87.3 kg)    BMI 34.10 kg/m   Physical Exam awake, alert.  Chest clear to auscultation bilaterally.  Heart with regular rate and rhythm.  Abdomen soft, positive bowel sounds, nontender.  No lower extremity edema.  Imaging: Ct Chest W Contrast  Result Date: 01/29/2019 CLINICAL DATA:  Colorectal versus pancreaticobiliary malignancy on recent omental biopsy, carcinomatosis with omental caking, staging workup. EXAM: CT CHEST, ABDOMEN, AND PELVIS WITH CONTRAST TECHNIQUE: Multidetector CT imaging of the chest, abdomen and pelvis was performed following the standard protocol during bolus administration of intravenous contrast. CONTRAST:  158mL OMNIPAQUE IOHEXOL 300 MG/ML  SOLN COMPARISON:  Pelvic MRI from 01/13/2019 and noncontrast CT images from the biopsy dated 01/21/2019 FINDINGS: CT CHEST FINDINGS Cardiovascular:  Unremarkable Mediastinum/Nodes: Lymph node between the clavicle and left pectoralis minor muscle on image 10/2 measures 1.0 cm in short axis. A right supraclavicular node measures 0.8 cm in short axis on image 8/2. At the thoracic inlet an upper mediastinal lymph node measures 0.6 cm in short axis on image 9/2. Lungs/Pleura: Mild biapical pleuroparenchymal scarring. There is mild atelectasis in the right lower lobe primarily along the diaphragm. Sub solid pulmonary nodule 0.6 by 0.4 cm in the left upper lobe on image 66/6, with severe some adjacent mild tree-in-bud nodularity. A left lower lobe nodule measuring 0.9 by 0.7 by 0.5 cm is present on image 93/6 with slightly irregular borders. 3 mm sub solid nodule in the upper portion of the right middle lobe on image 61/6, near the minor fissure. Musculoskeletal: Unremarkable CT ABDOMEN PELVIS FINDINGS Hepatobiliary: Heterogeneous high density in the gallbladder, favoring numerous stones filling the gallbladder. While there is some faint nodularity along the inferior capsule of the liver possibly from tumor studding, I do not see other abnormal hepatic parenchymal lesions to suggest parenchymal metastatic disease. No biliary dilatation. No obvious ampullary mass. Pancreas: Unremarkable Spleen: Unremarkable Adrenals/Urinary Tract: Mild left hydronephrosis and hydroureter extending down to indistinct tumor in the left hemipelvis. Mildly delayed excretion of the left kidney with asymmetric nephrogram. Stomach/Bowel: A low-density mass in the vicinity of the cecum measures about 4.8 by 3.0 cm on image 96/2. An irregular tubular structure lateral to the cecum on images 91 through 99 of series 2 probably represents separate appendix, although could conceivably represent an incidental tubular collection of tumor along the paracolic gutter rather than necessarily being appendix. Potential wall thickening of distal ileum for example on image 100/2 the pelvis.  Vascular/Lymphatic: Aortocaval lymph node 1.0 cm in short axis on image 73/2. Additional small external iliac nodes are present more notably on the right. There is extensive studding of tumor in the mesentery, some of which may be from adenopathy. Reproductive: Calcifications posteriorly in the myometrium are compatible with uterine fibroids. There is fluid and tumor along the adnexa, especially on the right. Other: Extensive omental caking of tumor with scattered nodularity throughout the omentum and along peritoneal surfaces. Mild perihepatic and Peri splenic as well as pelvic ascites. Musculoskeletal: Lower lumbar spondylosis and degenerative disc disease with potential impingement at L4-5 and L5-S1. IMPRESSION: 1. Extensive omental caking of tumor and peritoneal spread of tumor. There is a dominant mass in the cecum, and given the pattern of spread I would suspect mucinous adenocarcinoma. I believe that the irregular tubular structure lateral to the cecum is probably the appendix, and accordingly appendiceal mucocele would be less likely. There is likely some tumor studding along the liver but I do not see a definite parenchymal lesion in the liver, and no pancreatic lesion is identified. Tumor does extend down adjacent to the right adnexa, and based solely on today's imaging, an ovarian origin could not be excluded, but I do favor the cecum as the primary. 2. Mild ascites. 3. There is mild left hydronephrosis and  hydroureter with mild delayed left nephrogram likely due to tumor deposition around the distal ureter. Ureteral stent placement may be warranted. 4. An average size 7 mm left lower lobe pulmonary nodule is present with slightly irregular borders. Metastatic lesion is not excluded. 2 other small sub solid nodules are nonspecific. 5. Borderline prominent left subpectoral lymph node and upper normal sized right supraclavicular lymph node noted. These merit surveillance. There is also a borderline  prominent aortocaval lymph node. 6. The gallbladder is full of gallstones. 7. Potential mild lumbar impingement at L4-5 and L5-S1. 8. Uterine fibroids. Electronically Signed   By: Van Clines M.D.   On: 01/29/2019 13:34   Ct Abdomen Pelvis W Contrast  Result Date: 01/29/2019 CLINICAL DATA:  Colorectal versus pancreaticobiliary malignancy on recent omental biopsy, carcinomatosis with omental caking, staging workup. EXAM: CT CHEST, ABDOMEN, AND PELVIS WITH CONTRAST TECHNIQUE: Multidetector CT imaging of the chest, abdomen and pelvis was performed following the standard protocol during bolus administration of intravenous contrast. CONTRAST:  151mL OMNIPAQUE IOHEXOL 300 MG/ML  SOLN COMPARISON:  Pelvic MRI from 01/13/2019 and noncontrast CT images from the biopsy dated 01/21/2019 FINDINGS: CT CHEST FINDINGS Cardiovascular: Unremarkable Mediastinum/Nodes: Lymph node between the clavicle and left pectoralis minor muscle on image 10/2 measures 1.0 cm in short axis. A right supraclavicular node measures 0.8 cm in short axis on image 8/2. At the thoracic inlet an upper mediastinal lymph node measures 0.6 cm in short axis on image 9/2. Lungs/Pleura: Mild biapical pleuroparenchymal scarring. There is mild atelectasis in the right lower lobe primarily along the diaphragm. Sub solid pulmonary nodule 0.6 by 0.4 cm in the left upper lobe on image 66/6, with severe some adjacent mild tree-in-bud nodularity. A left lower lobe nodule measuring 0.9 by 0.7 by 0.5 cm is present on image 93/6 with slightly irregular borders. 3 mm sub solid nodule in the upper portion of the right middle lobe on image 61/6, near the minor fissure. Musculoskeletal: Unremarkable CT ABDOMEN PELVIS FINDINGS Hepatobiliary: Heterogeneous high density in the gallbladder, favoring numerous stones filling the gallbladder. While there is some faint nodularity along the inferior capsule of the liver possibly from tumor studding, I do not see other abnormal  hepatic parenchymal lesions to suggest parenchymal metastatic disease. No biliary dilatation. No obvious ampullary mass. Pancreas: Unremarkable Spleen: Unremarkable Adrenals/Urinary Tract: Mild left hydronephrosis and hydroureter extending down to indistinct tumor in the left hemipelvis. Mildly delayed excretion of the left kidney with asymmetric nephrogram. Stomach/Bowel: A low-density mass in the vicinity of the cecum measures about 4.8 by 3.0 cm on image 96/2. An irregular tubular structure lateral to the cecum on images 91 through 99 of series 2 probably represents separate appendix, although could conceivably represent an incidental tubular collection of tumor along the paracolic gutter rather than necessarily being appendix. Potential wall thickening of distal ileum for example on image 100/2 the pelvis. Vascular/Lymphatic: Aortocaval lymph node 1.0 cm in short axis on image 73/2. Additional small external iliac nodes are present more notably on the right. There is extensive studding of tumor in the mesentery, some of which may be from adenopathy. Reproductive: Calcifications posteriorly in the myometrium are compatible with uterine fibroids. There is fluid and tumor along the adnexa, especially on the right. Other: Extensive omental caking of tumor with scattered nodularity throughout the omentum and along peritoneal surfaces. Mild perihepatic and Peri splenic as well as pelvic ascites. Musculoskeletal: Lower lumbar spondylosis and degenerative disc disease with potential impingement at L4-5 and L5-S1. IMPRESSION: 1.  Extensive omental caking of tumor and peritoneal spread of tumor. There is a dominant mass in the cecum, and given the pattern of spread I would suspect mucinous adenocarcinoma. I believe that the irregular tubular structure lateral to the cecum is probably the appendix, and accordingly appendiceal mucocele would be less likely. There is likely some tumor studding along the liver but I do not see  a definite parenchymal lesion in the liver, and no pancreatic lesion is identified. Tumor does extend down adjacent to the right adnexa, and based solely on today's imaging, an ovarian origin could not be excluded, but I do favor the cecum as the primary. 2. Mild ascites. 3. There is mild left hydronephrosis and hydroureter with mild delayed left nephrogram likely due to tumor deposition around the distal ureter. Ureteral stent placement may be warranted. 4. An average size 7 mm left lower lobe pulmonary nodule is present with slightly irregular borders. Metastatic lesion is not excluded. 2 other small sub solid nodules are nonspecific. 5. Borderline prominent left subpectoral lymph node and upper normal sized right supraclavicular lymph node noted. These merit surveillance. There is also a borderline prominent aortocaval lymph node. 6. The gallbladder is full of gallstones. 7. Potential mild lumbar impingement at L4-5 and L5-S1. 8. Uterine fibroids. Electronically Signed   By: Van Clines M.D.   On: 01/29/2019 13:34    Labs:  CBC: Recent Labs    01/21/19 0730 02/02/19 0830  WBC 9.1 7.3  HGB 14.7 13.9  HCT 44.7 41.9  PLT 272 273    COAGS: Recent Labs    01/21/19 0730 02/02/19 0830  INR 0.9 1.0    BMP: Recent Labs    01/21/19 0756  NA 142  K 4.0  CL 109  CO2 23  GLUCOSE 104*  BUN 14  CALCIUM 8.7*  CREATININE 0.82  GFRNONAA >60  GFRAA >60    LIVER FUNCTION TESTS: Recent Labs    01/21/19 0756  BILITOT 0.6  AST 24  ALT 22  ALKPHOS 93  PROT 6.7  ALBUMIN 3.8    Assessment and Plan: Pt with history of metastatic adenocarcinoma involving the omentum as well as poor venous access ; presents again today for Port-A-Cath placement for palliative chemotherapy. Risks and benefits of image guided port-a-catheter placement was discussed with the patient including, but not limited to bleeding, infection, pneumothorax, or fibrin sheath development and need for additional  procedures.  All of the patient's questions were answered, patient is agreeable to proceed. Consent signed and in chart.     Electronically Signed: D. Rowe Robert, PA-C 02/02/2019, 8:53 AM   I spent a total of 25 minutes at the the patient's bedside AND on the patient's hospital floor or unit, greater than 50% of which was counseling/coordinating care for Port-A-Cath placement

## 2019-02-02 NOTE — Progress Notes (Signed)
Fax from [facility] that tissue was receivied on 01/31/19 and being processed.

## 2019-02-02 NOTE — Procedures (Signed)
Interventional Radiology Procedure Note  Procedure: Single Lumen Power Port Placement    Access:  Right IJ vein.  Findings: Catheter tip positioned at SVC/RA junction. Port is ready for immediate use.   Complications: None  EBL: < 10 mL  Recommendations:  - Ok to shower in 24 hours - Do not submerge for 7 days - Routine line care   [name], M.D Pager:  [phone]   

## 2019-02-03 ENCOUNTER — Ambulatory Visit: Payer: BC Managed Care – PPO | Admitting: Oncology

## 2019-02-03 ENCOUNTER — Telehealth: Payer: Self-pay | Admitting: *Deleted

## 2019-02-03 ENCOUNTER — Ambulatory Visit: Payer: BC Managed Care – PPO | Admitting: Nurse Practitioner

## 2019-02-03 ENCOUNTER — Other Ambulatory Visit: Payer: Self-pay

## 2019-02-03 MED ORDER — TRAMADOL HCL 50 MG PO TABS: 50.0000 mg | ORAL_TABLET | Freq: Four times a day (QID) | ORAL | 0 refills | Status: DC | PRN

## 2019-02-03 NOTE — Telephone Encounter (Signed)
Called to report the dressing placed over her new port that was placed yesterday is itching and looks red around perimeter. Asking if she can remove it. Suggested she come in and let RN look at it. Don't want to accidentally remove the glue prematurely.

## 2019-02-03 NOTE — Telephone Encounter (Signed)
Patient had called ahead to report area around her port dressing was red and itching. She presented to office for nurse to inspect area. Gently removed Tegaderm dressing and skin that was exposed to dressing was red and with a blister that ruptured (~1cm) at upper medial area of dressing. Will note allergy to Tegaderm in chart. Needs refill on her Tramadol. OK per Dr. Benay Spice.

## 2019-02-05 ENCOUNTER — Inpatient Hospital Stay: Payer: BC Managed Care – PPO | Attending: Gynecologic Oncology

## 2019-02-05 ENCOUNTER — Telehealth: Payer: Self-pay | Admitting: Oncology

## 2019-02-05 ENCOUNTER — Inpatient Hospital Stay (HOSPITAL_BASED_OUTPATIENT_CLINIC_OR_DEPARTMENT_OTHER): Payer: BC Managed Care – PPO | Admitting: Oncology

## 2019-02-05 ENCOUNTER — Other Ambulatory Visit: Payer: Self-pay

## 2019-02-05 VITALS — BP 159/97 | HR 87 | Temp 98.4°F | Resp 18 | Wt 192.5 lb

## 2019-02-05 DIAGNOSIS — R918 Other nonspecific abnormal finding of lung field: Secondary | ICD-10-CM | POA: Diagnosis not present

## 2019-02-05 DIAGNOSIS — C482 Malignant neoplasm of peritoneum, unspecified: Secondary | ICD-10-CM | POA: Insufficient documentation

## 2019-02-05 DIAGNOSIS — R188 Other ascites: Secondary | ICD-10-CM | POA: Diagnosis not present

## 2019-02-05 DIAGNOSIS — Z5111 Encounter for antineoplastic chemotherapy: Secondary | ICD-10-CM | POA: Insufficient documentation

## 2019-02-05 DIAGNOSIS — C786 Secondary malignant neoplasm of retroperitoneum and peritoneum: Secondary | ICD-10-CM | POA: Diagnosis not present

## 2019-02-05 DIAGNOSIS — C799 Secondary malignant neoplasm of unspecified site: Secondary | ICD-10-CM

## 2019-02-05 DIAGNOSIS — R112 Nausea with vomiting, unspecified: Secondary | ICD-10-CM | POA: Insufficient documentation

## 2019-02-05 DIAGNOSIS — N1330 Unspecified hydronephrosis: Secondary | ICD-10-CM | POA: Diagnosis not present

## 2019-02-05 DIAGNOSIS — R109 Unspecified abdominal pain: Secondary | ICD-10-CM | POA: Diagnosis not present

## 2019-02-05 DIAGNOSIS — Z79899 Other long term (current) drug therapy: Secondary | ICD-10-CM

## 2019-02-05 MED ORDER — SODIUM CHLORIDE 0.9% FLUSH
10.0000 mL | INTRAVENOUS | Status: DC | PRN
  Filled 2019-02-05: qty 10

## 2019-02-05 MED ORDER — DEXAMETHASONE SODIUM PHOSPHATE 10 MG/ML IJ SOLN
INTRAMUSCULAR | Status: AC
  Filled 2019-02-05: qty 1

## 2019-02-05 MED ORDER — DEXAMETHASONE SODIUM PHOSPHATE 10 MG/ML IJ SOLN
10.0000 mg | Freq: Once | INTRAMUSCULAR | Status: AC
  Administered 2019-02-05: 10 mg via INTRAVENOUS

## 2019-02-05 MED ORDER — FLUOROURACIL CHEMO INJECTION 2.5 GM/50ML
400.0000 mg/m2 | Freq: Once | INTRAVENOUS | Status: AC
  Administered 2019-02-05: 11:00:00 800 mg via INTRAVENOUS
  Filled 2019-02-05: qty 16

## 2019-02-05 MED ORDER — DEXTROSE 5 % IV SOLN
Freq: Once | INTRAVENOUS | Status: AC
  Administered 2019-02-05: 08:00:00 via INTRAVENOUS
  Filled 2019-02-05: qty 250

## 2019-02-05 MED ORDER — PALONOSETRON HCL INJECTION 0.25 MG/5ML
0.2500 mg | Freq: Once | INTRAVENOUS | Status: AC
  Administered 2019-02-05: 08:00:00 0.25 mg via INTRAVENOUS

## 2019-02-05 MED ORDER — LEUCOVORIN CALCIUM INJECTION 350 MG
400.0000 mg/m2 | Freq: Once | INTRAVENOUS | Status: AC
  Administered 2019-02-05: 788 mg via INTRAVENOUS
  Filled 2019-02-05: qty 39.4

## 2019-02-05 MED ORDER — SODIUM CHLORIDE 0.9 % IV SOLN
2550.0000 mg/m2 | INTRAVENOUS | Status: DC
  Administered 2019-02-05: 11:00:00 5000 mg via INTRAVENOUS
  Filled 2019-02-05: qty 100

## 2019-02-05 MED ORDER — DEXTROSE 5 % IV SOLN
Freq: Once | INTRAVENOUS | Status: AC
  Administered 2019-02-05: 09:00:00 via INTRAVENOUS
  Filled 2019-02-05: qty 250

## 2019-02-05 MED ORDER — OXALIPLATIN CHEMO INJECTION 100 MG/20ML
85.0000 mg/m2 | Freq: Once | INTRAVENOUS | Status: AC
  Administered 2019-02-05: 09:00:00 165 mg via INTRAVENOUS
  Filled 2019-02-05: qty 33

## 2019-02-05 MED ORDER — PALONOSETRON HCL INJECTION 0.25 MG/5ML
INTRAVENOUS | Status: AC
  Filled 2019-02-05: qty 5

## 2019-02-05 NOTE — Progress Notes (Signed)
[facility] OFFICE PROGRESS NOTE   Diagnosis: Colon cancer  INTERVAL HISTORY:   Veronica Stuart is here for the first cycle of chemotherapy.  She has persistent domino pain, improved with tramadol.  Her chief complaint is nausea.  She had one episode of emesis.  Her bowels are moving.   Objective:  Vital signs in last 24 hours:  There were no vitals taken for this visit.    GI: Soft, no mass, no hepatomegaly, mild tenderness in the right lower abdomen    Portacath/PICC-erythema consistent with a tape reaction at the right upper anterior chest  Lab Results:  Lab Results  Component Value Date   WBC 7.3 02/02/2019   HGB 13.9 02/02/2019   HCT 41.9 02/02/2019   MCV 84.8 02/02/2019   PLT 273 02/02/2019   NEUTROABS 6.8 01/21/2019    CMP  Lab Results  Component Value Date   NA 142 01/21/2019   K 4.0 01/21/2019   CL 109 01/21/2019   CO2 23 01/21/2019   GLUCOSE 104 (H) 01/21/2019   BUN 14 01/21/2019   CREATININE 0.82 01/21/2019   CALCIUM 8.7 (L) 01/21/2019   PROT 6.7 01/21/2019   ALBUMIN 3.8 01/21/2019   AST 24 01/21/2019   ALT 22 01/21/2019   ALKPHOS 93 01/21/2019   BILITOT 0.6 01/21/2019   GFRNONAA >60 01/21/2019   GFRAA >60 01/21/2019    Lab Results  Component Value Date   CEA1 8.03 (H) 01/15/2019     Imaging:  Ir Imaging Guided Port Insertion  Result Date: 02/02/2019 CLINICAL DATA:  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 A Port-A-Cath has been requested prior to initiating chemotherapy. EXAM: IMPLANTED PORT A CATH PLACEMENT WITH ULTRASOUND AND FLUOROSCOPIC GUIDANCE ANESTHESIA/SEDATION: 2.0 mg IV Versed; 100 mcg IV Fentanyl Total Moderate Sedation Time:  36 minutes The patient's level of consciousness and physiologic status were continuously monitored during the procedure by Radiology nursing. Additional Medications: 2 g IV Ancef. FLUOROSCOPY TIME:  1 minutes and 18  seconds.  18.5 mGy. PROCEDURE: The procedure, risks, benefits, and alternatives were explained to the patient. Questions regarding the procedure were encouraged and answered. The patient understands and consents to the procedure. A time-out was performed prior to initiating the procedure. Ultrasound was utilized to confirm patency of the right internal jugular vein. The right neck and chest were prepped with chlorhexidine in a sterile fashion, and a sterile drape was applied covering the operative field. Maximum barrier sterile technique with sterile gowns and gloves were used for the procedure. Local anesthesia was provided with 1% lidocaine. After creating a small venotomy incision, a 21 gauge needle was advanced into the right internal jugular vein under direct, real-time ultrasound guidance. Ultrasound image documentation was performed. After securing guidewire access, an 8 Fr dilator was placed. A J-wire was kinked to measure appropriate catheter length. A subcutaneous port pocket was then created along the upper chest wall utilizing sharp and blunt dissection. Portable cautery was utilized. The pocket was irrigated with sterile saline. A single lumen power injectable port was chosen for placement. The 8 Fr catheter was tunneled from the port pocket site to the venotomy incision. The port was placed in the pocket. External catheter was trimmed to appropriate length based on guidewire measurement. At the venotomy, an 8 Fr peel-away sheath was placed over a guidewire. The catheter was then placed through the sheath and the sheath removed. Final catheter positioning was confirmed and documented with a  fluoroscopic spot image. The port was accessed with a needle and aspirated and flushed with heparinized saline. The access needle was removed. The venotomy and port pocket incisions were closed with subcutaneous 3-0 Monocryl and subcuticular 4-0 Vicryl. Dermabond was applied to both incisions. COMPLICATIONS:  COMPLICATIONS None FINDINGS: After catheter placement, the tip lies at the cavo-atrial junction. The catheter aspirates normally and is ready for immediate use. IMPRESSION: Placement of single lumen port a cath via right internal jugular vein. The catheter tip lies at the cavo-atrial junction. A power injectable port a cath was placed and is ready for immediate use. Electronically Signed   By: Aletta Edouard M.D.   On: 02/02/2019 10:45    Medications: I have reviewed the patient's current medications.   Assessment/Plan: 1. 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  Numerous soft tissue implants throughout the visualized lower mesentery with a dominant 4.4 cm right lower quadrant peritoneal implant.  Small volume pelvic ascites.  3.8 cm right ovarian mass.  01/15/2019-CEA 8  01/21/2019- CT biopsy of omental thickening left lower abdomen; pathology showed metastatic adenocarcinoma, positive for CDX-2, cytokeratin 20 and cytokeratin 7.  Tumor negative for GATA-3, NapsinA, PAX 8, TTF-1, WT-1 and ER.  CTs 01/29/2019- extensive omental caking, dominant mass at the cecum, mild ascites, mild left hydronephrosis and hydroureter, indeterminate pulmonary nodules, borderline prominent left subpectoral node and upper normal size right supraclavicular node, borderline prominent aortocaval node  Cycle 1 FOLFOX 02/05/2019 2. Intermittent nausea/vomiting, abdominal pain-question obstructive symptoms 3. Hypothyroidism 4. Port-A-Cath placement 02/02/2019 5. Mild left hydronephrosis noted on CT 01/29/2019    Disposition: Ms. Clendenen has been diagnosed with metastatic adenocarcinoma involving  an omental biopsy.  The staging CT scans are most consistent with a cecal primary.  I reviewed the CT images and discussed treatment options with Ms. Kempker.  She understands no therapy will be curative.  The plan is to begin FOLFOX today.  I will make referral to Dr. Clovis Riley to see if she will be a candidate for cytoreductive surgery and intraperitoneal chemotherapy.  Hopefully the nausea will improve with treatment of the cancer.  She will discontinue nonessential vitamins.  She will return for an office visit and cycle 2 chemotherapy in 2 weeks.  Betsy Coder, MD  02/05/2019  11:32 AM

## 2019-02-05 NOTE — Patient Instructions (Signed)
[facility] Discharge Instructions for Patients Receiving Chemotherapy  Today you received the following chemotherapy agents Oxaliplatin, Leucovorin, Fluorouracil.  To help prevent nausea and vomiting after your treatment, we encourage you to take your nausea medication as directed.  If you develop nausea and vomiting that is not controlled by your nausea medication, call the clinic.   BELOW ARE SYMPTOMS THAT SHOULD BE REPORTED IMMEDIATELY:  *FEVER GREATER THAN 100.5 F  *CHILLS WITH OR WITHOUT FEVER  NAUSEA AND VOMITING THAT IS NOT CONTROLLED WITH YOUR NAUSEA MEDICATION  *UNUSUAL SHORTNESS OF BREATH  *UNUSUAL BRUISING OR BLEEDING  TENDERNESS IN MOUTH AND THROAT WITH OR WITHOUT PRESENCE OF ULCERS  *URINARY PROBLEMS  *BOWEL PROBLEMS  UNUSUAL RASH Items with * indicate a potential emergency and should be followed up as soon as possible.  Feel free to call the clinic should you have any questions or concerns. The clinic phone number is [phone].  Please show the [facility] at check-in to the Emergency Department and triage nurse.  Oxaliplatin Injection What is this medicine? OXALIPLATIN (ox AL i PLA tin) is a chemotherapy drug. It targets fast dividing cells, like cancer cells, and causes these cells to die. This medicine is used to treat cancers of the colon and rectum, and many other cancers. This medicine may be used for other purposes; ask your health care provider or pharmacist if you have questions. COMMON BRAND NAME(S): Eloxatin What should I tell my health care provider before I take this medicine? They need to know if you have any of these conditions: -kidney disease -an unusual or allergic reaction to oxaliplatin, other chemotherapy, other medicines, foods, dyes, or preservatives -pregnant or trying to get pregnant -breast-feeding How should I use this medicine? This drug is given as an infusion into a vein. It is administered in a hospital  or clinic by a specially trained health care professional. Talk to your pediatrician regarding the use of this medicine in children. Special care may be needed. Overdosage: If you think you have taken too much of this medicine contact a poison control center or emergency room at once. NOTE: This medicine is only for you. Do not share this medicine with others. What if I miss a dose? It is important not to miss a dose. Call your doctor or health care professional if you are unable to keep an appointment. What may interact with this medicine? -medicines to increase blood counts like filgrastim, pegfilgrastim, sargramostim -probenecid -some antibiotics like amikacin, gentamicin, neomycin, polymyxin B, streptomycin, tobramycin -zalcitabine Talk to your doctor or health care professional before taking any of these medicines: -acetaminophen -aspirin -ibuprofen -ketoprofen -naproxen This list may not describe all possible interactions. Give your health care provider a list of all the medicines, herbs, non-prescription drugs, or dietary supplements you use. Also tell them if you smoke, drink alcohol, or use illegal drugs. Some items may interact with your medicine. What should I watch for while using this medicine? Your condition will be monitored carefully while you are receiving this medicine. You will need important blood work done while you are taking this medicine. This medicine can make you more sensitive to cold. Do not drink cold drinks or use ice. Cover exposed skin before coming in contact with cold temperatures or cold objects. When out in cold weather wear warm clothing and cover your mouth and nose to warm the air that goes into your lungs. Tell your doctor if you get sensitive to the cold. This drug  may make you feel generally unwell. This is not uncommon, as chemotherapy can affect healthy cells as well as cancer cells. Report any side effects. Continue your course of treatment even  though you feel ill unless your doctor tells you to stop. In some cases, you may be given additional medicines to help with side effects. Follow all directions for their use. Call your doctor or health care professional for advice if you get a fever, chills or sore throat, or other symptoms of a cold or flu. Do not treat yourself. This drug decreases your body's ability to fight infections. Try to avoid being around people who are sick. This medicine may increase your risk to bruise or bleed. Call your doctor or health care professional if you notice any unusual bleeding. Be careful brushing and flossing your teeth or using a toothpick because you may get an infection or bleed more easily. If you have any dental work done, tell your dentist you are receiving this medicine. Avoid taking products that contain aspirin, acetaminophen, ibuprofen, naproxen, or ketoprofen unless instructed by your doctor. These medicines may hide a fever. Do not become pregnant while taking this medicine. Women should inform their doctor if they wish to become pregnant or think they might be pregnant. There is a potential for serious side effects to an unborn child. Talk to your health care professional or pharmacist for more information. Do not breast-feed an infant while taking this medicine. Call your doctor or health care professional if you get diarrhea. Do not treat yourself. What side effects may I notice from receiving this medicine? Side effects that you should report to your doctor or health care professional as soon as possible: -allergic reactions like skin rash, itching or hives, swelling of the face, lips, or tongue -low blood counts - This drug may decrease the number of white blood cells, red blood cells and platelets. You may be at increased risk for infections and bleeding. -signs of infection - fever or chills, cough, sore throat, pain or difficulty passing urine -signs of decreased platelets or bleeding -  bruising, pinpoint red spots on the skin, black, tarry stools, nosebleeds -signs of decreased red blood cells - unusually weak or tired, fainting spells, lightheadedness -breathing problems -chest pain, pressure -cough -diarrhea -jaw tightness -mouth sores -nausea and vomiting -pain, swelling, redness or irritation at the injection site -pain, tingling, numbness in the hands or feet -problems with balance, talking, walking -redness, blistering, peeling or loosening of the skin, including inside the mouth -trouble passing urine or change in the amount of urine Side effects that usually do not require medical attention (report to your doctor or health care professional if they continue or are bothersome): -changes in vision -constipation -hair loss -loss of appetite -metallic taste in the mouth or changes in taste -stomach pain This list may not describe all possible side effects. Call your doctor for medical advice about side effects. You may report side effects to FDA at 1-800-FDA-1088. Where should I keep my medicine? This drug is given in a hospital or clinic and will not be stored at home. NOTE: This sheet is a summary. It may not cover all possible information. If you have questions about this medicine, talk to your doctor, pharmacist, or health care provider.  2019 Elsevier/Gold Standard (2008-05-11 17:22:47)  Leucovorin injection What is this medicine? LEUCOVORIN (loo koe VOR in) is used to prevent or treat the harmful effects of some medicines. This medicine is used to treat anemia caused  by a low amount of folic acid in the body. It is also used with 5-fluorouracil (5-FU) to treat colon cancer. This medicine may be used for other purposes; ask your health care provider or pharmacist if you have questions. What should I tell my health care provider before I take this medicine? They need to know if you have any of these conditions: -anemia from low levels of vitamin B-12 in  the blood -an unusual or allergic reaction to leucovorin, folic acid, other medicines, foods, dyes, or preservatives -pregnant or trying to get pregnant -breast-feeding How should I use this medicine? This medicine is for injection into a muscle or into a vein. It is given by a health care professional in a hospital or clinic setting. Talk to your pediatrician regarding the use of this medicine in children. Special care may be needed. Overdosage: If you think you have taken too much of this medicine contact a poison control center or emergency room at once. NOTE: This medicine is only for you. Do not share this medicine with others. What if I miss a dose? This does not apply. What may interact with this medicine? -capecitabine -fluorouracil -phenobarbital -phenytoin -primidone -trimethoprim-sulfamethoxazole This list may not describe all possible interactions. Give your health care provider a list of all the medicines, herbs, non-prescription drugs, or dietary supplements you use. Also tell them if you smoke, drink alcohol, or use illegal drugs. Some items may interact with your medicine. What should I watch for while using this medicine? Your condition will be monitored carefully while you are receiving this medicine. This medicine may increase the side effects of 5-fluorouracil, 5-FU. Tell your doctor or health care professional if you have diarrhea or mouth sores that do not get better or that get worse. What side effects may I notice from receiving this medicine? Side effects that you should report to your doctor or health care professional as soon as possible: -allergic reactions like skin rash, itching or hives, swelling of the face, lips, or tongue -breathing problems -fever, infection -mouth sores -unusual bleeding or bruising -unusually weak or tired Side effects that usually do not require medical attention (report to your doctor or health care professional if they continue or  are bothersome): -constipation or diarrhea -loss of appetite -nausea, vomiting This list may not describe all possible side effects. Call your doctor for medical advice about side effects. You may report side effects to FDA at 1-800-FDA-1088. Where should I keep my medicine? This drug is given in a hospital or clinic and will not be stored at home. NOTE: This sheet is a summary. It may not cover all possible information. If you have questions about this medicine, talk to your doctor, pharmacist, or health care provider.  2019 Elsevier/Gold Standard (2008-04-20 16:50:29)   Fluorouracil, 5-FU injection What is this medicine? FLUOROURACIL, 5-FU (flure oh YOOR a sil) is a chemotherapy drug. It slows the growth of cancer cells. This medicine is used to treat many types of cancer like breast cancer, colon or rectal cancer, pancreatic cancer, and stomach cancer. This medicine may be used for other purposes; ask your health care provider or pharmacist if you have questions. COMMON BRAND NAME(S): Adrucil What should I tell my health care provider before I take this medicine? They need to know if you have any of these conditions: -blood disorders -dihydropyrimidine dehydrogenase (DPD) deficiency -infection (especially a virus infection such as chickenpox, cold sores, or herpes) -kidney disease -liver disease -malnourished, poor nutrition -recent or ongoing  radiation therapy -an unusual or allergic reaction to fluorouracil, other chemotherapy, other medicines, foods, dyes, or preservatives -pregnant or trying to get pregnant -breast-feeding How should I use this medicine? This drug is given as an infusion or injection into a vein. It is administered in a hospital or clinic by a specially trained health care professional. Talk to your pediatrician regarding the use of this medicine in children. Special care may be needed. Overdosage: If you think you have taken too much of this medicine contact a  poison control center or emergency room at once. NOTE: This medicine is only for you. Do not share this medicine with others. What if I miss a dose? It is important not to miss your dose. Call your doctor or health care professional if you are unable to keep an appointment. What may interact with this medicine? -allopurinol -cimetidine -dapsone -digoxin -hydroxyurea -leucovorin -levamisole -medicines for seizures like ethotoin, fosphenytoin, phenytoin -medicines to increase blood counts like filgrastim, pegfilgrastim, sargramostim -medicines that treat or prevent blood clots like warfarin, enoxaparin, and dalteparin -methotrexate -metronidazole -pyrimethamine -some other chemotherapy drugs like busulfan, cisplatin, estramustine, vinblastine -trimethoprim -trimetrexate -vaccines Talk to your doctor or health care professional before taking any of these medicines: -acetaminophen -aspirin -ibuprofen -ketoprofen -naproxen This list may not describe all possible interactions. Give your health care provider a list of all the medicines, herbs, non-prescription drugs, or dietary supplements you use. Also tell them if you smoke, drink alcohol, or use illegal drugs. Some items may interact with your medicine. What should I watch for while using this medicine? Visit your doctor for checks on your progress. This drug may make you feel generally unwell. This is not uncommon, as chemotherapy can affect healthy cells as well as cancer cells. Report any side effects. Continue your course of treatment even though you feel ill unless your doctor tells you to stop. In some cases, you may be given additional medicines to help with side effects. Follow all directions for their use. Call your doctor or health care professional for advice if you get a fever, chills or sore throat, or other symptoms of a cold or flu. Do not treat yourself. This drug decreases your body's ability to fight infections. Try to  avoid being around people who are sick. This medicine may increase your risk to bruise or bleed. Call your doctor or health care professional if you notice any unusual bleeding. Be careful brushing and flossing your teeth or using a toothpick because you may get an infection or bleed more easily. If you have any dental work done, tell your dentist you are receiving this medicine. Avoid taking products that contain aspirin, acetaminophen, ibuprofen, naproxen, or ketoprofen unless instructed by your doctor. These medicines may hide a fever. Do not become pregnant while taking this medicine. Women should inform their doctor if they wish to become pregnant or think they might be pregnant. There is a potential for serious side effects to an unborn child. Talk to your health care professional or pharmacist for more information. Do not breast-feed an infant while taking this medicine. Men should inform their doctor if they wish to father a child. This medicine may lower sperm counts. Do not treat diarrhea with over the counter products. Contact your doctor if you have diarrhea that lasts more than 2 days or if it is severe and watery. This medicine can make you more sensitive to the sun. Keep out of the sun. If you cannot avoid being in the sun, wear   protective clothing and use sunscreen. Do not use sun lamps or tanning beds/booths. What side effects may I notice from receiving this medicine? Side effects that you should report to your doctor or health care professional as soon as possible: -allergic reactions like skin rash, itching or hives, swelling of the face, lips, or tongue -low blood counts - this medicine may decrease the number of white blood cells, red blood cells and platelets. You may be at increased risk for infections and bleeding. -signs of infection - fever or chills, cough, sore throat, pain or difficulty passing urine -signs of decreased platelets or bleeding - bruising, pinpoint red spots  on the skin, black, tarry stools, blood in the urine -signs of decreased red blood cells - unusually weak or tired, fainting spells, lightheadedness -breathing problems -changes in vision -chest pain -mouth sores -nausea and vomiting -pain, swelling, redness at site where injected -pain, tingling, numbness in the hands or feet -redness, swelling, or sores on hands or feet -stomach pain -unusual bleeding Side effects that usually do not require medical attention (report to your doctor or health care professional if they continue or are bothersome): -changes in finger or toe nails -diarrhea -dry or itchy skin -hair loss -headache -loss of appetite -sensitivity of eyes to the light -stomach upset -unusually teary eyes This list may not describe all possible side effects. Call your doctor for medical advice about side effects. You may report side effects to FDA at 1-800-FDA-1088. Where should I keep my medicine? This drug is given in a hospital or clinic and will not be stored at home. NOTE: This sheet is a summary. It may not cover all possible information. If you have questions about this medicine, talk to your doctor, pharmacist, or health care provider.  2019 Elsevier/Gold Standard (2008-02-18 13:53:16)

## 2019-02-05 NOTE — Telephone Encounter (Signed)
Scheduled appt per 4/9 los. °

## 2019-02-05 NOTE — Progress Notes (Signed)
Per Dr. Benay Spice: OK to treat based on labs of 02/02/19 and 01/21/19 (1st chemo).

## 2019-02-06 ENCOUNTER — Telehealth: Payer: Self-pay | Admitting: *Deleted

## 2019-02-06 ENCOUNTER — Inpatient Hospital Stay: Payer: BC Managed Care – PPO

## 2019-02-06 ENCOUNTER — Other Ambulatory Visit: Payer: Self-pay | Admitting: *Deleted

## 2019-02-06 ENCOUNTER — Other Ambulatory Visit: Payer: Self-pay

## 2019-02-06 DIAGNOSIS — C799 Secondary malignant neoplasm of unspecified site: Secondary | ICD-10-CM

## 2019-02-06 DIAGNOSIS — C482 Malignant neoplasm of peritoneum, unspecified: Secondary | ICD-10-CM | POA: Diagnosis not present

## 2019-02-06 MED ORDER — SODIUM CHLORIDE 0.9 % IV SOLN: 10.0000 mg | Freq: Once | INTRAVENOUS | Status: DC

## 2019-02-06 MED ORDER — SODIUM CHLORIDE 0.9 % IV SOLN
INTRAVENOUS | Status: DC
  Filled 2019-02-06: qty 250

## 2019-02-06 MED ORDER — LORAZEPAM 2 MG/ML IJ SOLN
0.5000 mg | Freq: Once | INTRAMUSCULAR | Status: AC
  Administered 2019-02-06: 0.5 mg via INTRAVENOUS

## 2019-02-06 MED ORDER — DEXAMETHASONE SODIUM PHOSPHATE 10 MG/ML IJ SOLN
10.0000 mg | Freq: Once | INTRAMUSCULAR | Status: AC
  Administered 2019-02-06: 10 mg via INTRAVENOUS

## 2019-02-06 MED ORDER — ONDANSETRON HCL 8 MG PO TABS: 8.0000 mg | ORAL_TABLET | Freq: Three times a day (TID) | ORAL | 1 refills | Status: DC | PRN

## 2019-02-06 MED ORDER — DEXAMETHASONE SODIUM PHOSPHATE 10 MG/ML IJ SOLN
INTRAMUSCULAR | Status: AC
  Filled 2019-02-06: qty 1

## 2019-02-06 MED ORDER — LORAZEPAM 2 MG/ML IJ SOLN
INTRAMUSCULAR | Status: AC
  Filled 2019-02-06: qty 1

## 2019-02-06 MED ORDER — LORAZEPAM 0.5 MG PO TABS: 0.5000 mg | ORAL_TABLET | Freq: Four times a day (QID) | ORAL | 0 refills | Status: AC | PRN

## 2019-02-06 MED ORDER — SODIUM CHLORIDE 0.9 % IV SOLN
INTRAVENOUS | Status: DC
  Administered 2019-02-06: 11:00:00 via INTRAVENOUS
  Filled 2019-02-06 (×2): qty 250

## 2019-02-06 NOTE — Progress Notes (Signed)
Notified patient that her scripts were sent to [facility] that was listed has her primary.  She reports she no longer goes there, but will go ahead and pick it up from there. Requested that pharmacy be removed from her list and this was done as requested.

## 2019-02-06 NOTE — Patient Instructions (Signed)
Rehydration, Adult Rehydration is the replacement of body fluids and salts and minerals (electrolytes) that are lost during dehydration. Dehydration is when there is not enough fluid or water in the body. This happens when you lose more fluids than you take in. Common causes of dehydration include:  Vomiting.  Diarrhea.  Excessive sweating, such as from heat exposure or exercise.  Taking medicines that cause the body to lose excess fluid (diuretics).  Impaired kidney function.  Not drinking enough fluid.  Certain illnesses or infections.  Certain poorly controlled long-term (chronic) illnesses, such as diabetes, heart disease, and kidney disease.  Symptoms of mild dehydration may include thirst, dry lips and mouth, dry skin, and dizziness. Symptoms of severe dehydration may include increased heart rate, confusion, fainting, and not urinating. You can rehydrate by drinking certain fluids or getting fluids through an IV tube, as told by your health care provider. What are the risks? Generally, rehydration is safe. However, one problem that can happen is taking in too much fluid (overhydration). This is rare. If overhydration happens, it can cause an electrolyte imbalance, kidney failure, or a decrease in salt (sodium) levels in the body. How to rehydrate Follow instructions from your health care provider for rehydration. The kind of fluid you should drink and the amount you should drink depend on your condition.  If directed by your health care provider, drink an oral rehydration solution (ORS). This is a drink designed to treat dehydration that is found in pharmacies and retail stores. ? Make an ORS by following instructions on the package. ? Start by drinking small amounts, about  cup (120 mL) every 5-10 minutes. ? Slowly increase how much you drink until you have taken the amount recommended by your health care provider.  Drink enough clear fluids to keep your urine clear or pale  yellow. If you were instructed to drink an ORS, finish the ORS first, then start slowly drinking other clear fluids. Drink fluids such as: ? Water. Do not drink only water. Doing that can lead to having too little sodium in your body (hyponatremia). ? Ice chips. ? Fruit juice that you have added water to (diluted juice). ? Low-calorie sports drinks.  If you are severely dehydrated, your health care provider may recommend that you receive fluids through an IV tube in the hospital.  Do not take sodium tablets. Doing that can lead to the condition of having too much sodium in your body (hypernatremia). Eating while you rehydrate Follow instructions from your health care provider about what to eat while you rehydrate. Your health care provider may recommend that you slowly begin eating regular foods in small amounts.  Eat foods that contain a healthy balance of electrolytes, such as bananas, oranges, potatoes, tomatoes, and spinach.  Avoid foods that are greasy or contain a lot of fat or sugar.  In some cases, you may get nutrition through a feeding tube that is passed through your nose and into your stomach (nasogastric tube, or NG tube). This may be done if you have uncontrolled vomiting or diarrhea. Beverages to avoid Certain beverages may make dehydration worse. While you rehydrate, avoid:  Alcohol.  Caffeine.  Drinks that contain a lot of sugar. These include: ? High-calorie sports drinks. ? Fruit juice that is not diluted. ? Soda.  Check nutrition labels to see how much sugar or caffeine a beverage contains. Signs of dehydration recovery You may be recovering from dehydration if:  You are urinating more often than before you started   rehydrating.  Your urine is clear or pale yellow.  Your energy level improves.  You vomit less frequently.  You have diarrhea less frequently.  Your appetite improves or returns to normal.  You feel less dizzy or less light-headed.  Your  skin tone and color start to look more normal. Contact a health care provider if:  You continue to have symptoms of mild dehydration, such as: ? Thirst. ? Dry lips. ? Slightly dry mouth. ? Dry, warm skin. ? Dizziness.  You continue to vomit or have diarrhea. Get help right away if:  You have symptoms of dehydration that get worse.  You feel: ? Confused. ? Weak. ? Like you are going to faint.  You have not urinated in 6-8 hours.  You have very dark urine.  You have trouble breathing.  Your heart rate while sitting still is over 100 beats a minute.  You cannot drink fluids without vomiting.  You have vomiting or diarrhea that: ? Gets worse. ? Does not go away.  You have a fever. This information is not intended to replace advice given to you by your health care provider. Make sure you discuss any questions you have with your health care provider. Document Released: 01/07/2012 Document Revised: 05/04/2016 Document Reviewed: 12/09/2015 Elsevier Interactive Patient Education  2019 [employer]  Coronavirus (COVID-19) Are you at risk?  Are you at risk for the Coronavirus (COVID-19)?  To be considered HIGH RISK for Coronavirus (COVID-19), you have to meet the following criteria:  . Traveled to [place], [place], [place], [place] or [place]; or in the [place] to [place], [place], [place], or [place]; and have fever, cough, and shortness of breath within the last 2 weeks of travel OR . Been in close contact with a person diagnosed with COVID-19 within the last 2 weeks and have fever, cough, and shortness of breath . IF YOU DO NOT MEET THESE CRITERIA, YOU ARE CONSIDERED LOW RISK FOR COVID-19.  What to do if you are HIGH RISK for COVID-19?  . If you are having a medical emergency, call 911. . Seek medical care right away. Before you go to a doctor's office, urgent care or emergency department, call ahead and tell them about your recent travel, contact with  someone diagnosed with COVID-19, and your symptoms. You should receive instructions from your physician's office regarding next steps of care.  . When you arrive at healthcare provider, tell the healthcare staff immediately you have returned from visiting [place], [place], [place], [place] or [place]; or traveled in the [place] to [place], [place], [place], or [place]; in the last two weeks or you have been in close contact with a person diagnosed with COVID-19 in the last 2 weeks.   . Tell the health care staff about your symptoms: fever, cough and shortness of breath. . After you have been seen by a medical provider, you will be either: o Tested for (COVID-19) and discharged home on quarantine except to seek medical care if symptoms worsen, and asked to  - Stay home and avoid contact with others until you get your results (4-5 days)  - Avoid travel on public transportation if possible (such as bus, train, or airplane) or o Sent to the Emergency Department by EMS for evaluation, COVID-19 testing, and possible admission depending on your condition and test results.  What to do if you are LOW RISK for COVID-19?  Reduce your risk of any infection by using the same   precautions used for avoiding the common cold or flu:  . Wash your hands often with soap and warm water for at least 20 seconds.  If soap and water are not readily available, use an alcohol-based hand sanitizer with at least 60% alcohol.  . If coughing or sneezing, cover your mouth and nose by coughing or sneezing into the elbow areas of your shirt or coat, into a tissue or into your sleeve (not your hands). . Avoid shaking hands with others and consider head nods or verbal greetings only. . Avoid touching your eyes, nose, or mouth with unwashed hands.  . Avoid close contact with people who are sick. . Avoid places or events with large numbers of people in one location, like concerts or sporting events. . Carefully consider  travel plans you have or are making. . If you are planning any travel outside or inside the [place], visit the CDC's Travelers' Health webpage for the latest health notices. . If you have some symptoms but not all symptoms, continue to monitor at home and seek medical attention if your symptoms worsen. . If you are having a medical emergency, call 911.   ADDITIONAL HEALTHCARE OPTIONS FOR PATIENTS  [facility] / e-Visit: [email or web address]         [facility]: [phone]  [facility]: [phone]                   [facility]: [phone]   

## 2019-02-06 NOTE — Telephone Encounter (Addendum)
Call to follow up on report of nausea last night. Left VM requesting return call. RN called back and spoke w/daughter. Last took antinausea med at 1:00am and have vomited X 3 since. Tried sip of ginger ale and it came back as well. Informed her we will get her in today for IV fluids and nausea med. Scheduler will be calling within the hour.

## 2019-02-07 ENCOUNTER — Inpatient Hospital Stay: Payer: BC Managed Care – PPO

## 2019-02-07 VITALS — BP 154/92 | HR 82 | Temp 98.9°F | Resp 16

## 2019-02-07 DIAGNOSIS — C482 Malignant neoplasm of peritoneum, unspecified: Secondary | ICD-10-CM | POA: Diagnosis not present

## 2019-02-07 DIAGNOSIS — C799 Secondary malignant neoplasm of unspecified site: Secondary | ICD-10-CM

## 2019-02-07 MED ORDER — DEXAMETHASONE SODIUM PHOSPHATE 10 MG/ML IJ SOLN
INTRAMUSCULAR | Status: AC
  Filled 2019-02-07: qty 1

## 2019-02-07 MED ORDER — HEPARIN SOD (PORK) LOCK FLUSH 100 UNIT/ML IV SOLN
500.0000 [IU] | Freq: Once | INTRAVENOUS | Status: AC | PRN
  Administered 2019-02-07: 11:00:00 500 [IU]
  Filled 2019-02-07: qty 5

## 2019-02-07 MED ORDER — SODIUM CHLORIDE 0.9 % IV SOLN
10.0000 mg | Freq: Once | INTRAVENOUS | Status: AC
  Administered 2019-02-07: 10 mg via INTRAVENOUS

## 2019-02-07 MED ORDER — SODIUM CHLORIDE 0.9 % IV SOLN
INTRAVENOUS | Status: DC
  Administered 2019-02-07: 10:00:00 via INTRAVENOUS
  Filled 2019-02-07 (×2): qty 250

## 2019-02-07 MED ORDER — SODIUM CHLORIDE 0.9% FLUSH
10.0000 mL | INTRAVENOUS | Status: DC | PRN
  Administered 2019-02-07: 10 mL
  Filled 2019-02-07: qty 10

## 2019-02-10 ENCOUNTER — Telehealth: Payer: Self-pay | Admitting: Oncology

## 2019-02-10 NOTE — Telephone Encounter (Signed)
Faxed records to Dr Clovis Riley

## 2019-02-11 ENCOUNTER — Encounter (HOSPITAL_COMMUNITY): Payer: Self-pay | Admitting: Oncology

## 2019-02-15 ENCOUNTER — Other Ambulatory Visit: Payer: Self-pay | Admitting: Oncology

## 2019-02-18 ENCOUNTER — Telehealth: Payer: Self-pay | Admitting: *Deleted

## 2019-02-18 NOTE — Telephone Encounter (Signed)
Called to inquire about new anti-nausea med Dr. Benay Spice would order for next cycle of chemo. Informed her this was for Emend, which will be given IV in office as premed. Reviewed her current home meds for nausea and how these are to be taken. 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 2) Letter stating she is receiving chemotherapy and may need to miss work for chemo appointments and management of side effects and may require leave from work in future at treatment continues. This RN suggested she check with her employer to see if she should obtain FMLA paperwork.

## 2019-02-19 ENCOUNTER — Encounter: Payer: Self-pay | Admitting: Nurse Practitioner

## 2019-02-19 ENCOUNTER — Inpatient Hospital Stay: Payer: BC Managed Care – PPO

## 2019-02-19 ENCOUNTER — Inpatient Hospital Stay (HOSPITAL_BASED_OUTPATIENT_CLINIC_OR_DEPARTMENT_OTHER): Payer: BC Managed Care – PPO | Admitting: Nurse Practitioner

## 2019-02-19 ENCOUNTER — Other Ambulatory Visit: Payer: Self-pay

## 2019-02-19 ENCOUNTER — Telehealth: Payer: Self-pay | Admitting: Nurse Practitioner

## 2019-02-19 VITALS — BP 153/89 | HR 84 | Temp 97.9°F | Resp 18 | Ht 63.0 in | Wt 187.1 lb

## 2019-02-19 DIAGNOSIS — C799 Secondary malignant neoplasm of unspecified site: Secondary | ICD-10-CM

## 2019-02-19 DIAGNOSIS — R519 Headache, unspecified: Secondary | ICD-10-CM

## 2019-02-19 DIAGNOSIS — C482 Malignant neoplasm of peritoneum, unspecified: Secondary | ICD-10-CM | POA: Diagnosis not present

## 2019-02-19 DIAGNOSIS — R188 Other ascites: Secondary | ICD-10-CM

## 2019-02-19 DIAGNOSIS — N1330 Unspecified hydronephrosis: Secondary | ICD-10-CM | POA: Diagnosis not present

## 2019-02-19 DIAGNOSIS — Z79899 Other long term (current) drug therapy: Secondary | ICD-10-CM

## 2019-02-19 DIAGNOSIS — C786 Secondary malignant neoplasm of retroperitoneum and peritoneum: Secondary | ICD-10-CM | POA: Diagnosis not present

## 2019-02-19 DIAGNOSIS — R51 Headache: Secondary | ICD-10-CM

## 2019-02-19 DIAGNOSIS — R112 Nausea with vomiting, unspecified: Secondary | ICD-10-CM

## 2019-02-19 DIAGNOSIS — R918 Other nonspecific abnormal finding of lung field: Secondary | ICD-10-CM

## 2019-02-19 DIAGNOSIS — Z95828 Presence of other vascular implants and grafts: Secondary | ICD-10-CM | POA: Insufficient documentation

## 2019-02-19 DIAGNOSIS — C800 Disseminated malignant neoplasm, unspecified: Secondary | ICD-10-CM

## 2019-02-19 DIAGNOSIS — R109 Unspecified abdominal pain: Secondary | ICD-10-CM

## 2019-02-19 LAB — CMP (CANCER CENTER ONLY)
ALT: 65 U/L — ABNORMAL HIGH (ref 0–44)
AST: 53 U/L — ABNORMAL HIGH (ref 15–41)
Albumin: 3.5 g/dL (ref 3.5–5.0)
Alkaline Phosphatase: 127 U/L — ABNORMAL HIGH (ref 38–126)
Anion gap: 12 (ref 5–15)
BUN: 11 mg/dL (ref 6–20)
CO2: 25 mmol/L (ref 22–32)
Calcium: 8.5 mg/dL — ABNORMAL LOW (ref 8.9–10.3)
Chloride: 107 mmol/L (ref 98–111)
Creatinine: 0.8 mg/dL (ref 0.44–1.00)
GFR, Est AFR Am: >60 mL/min (ref >60)
GFR, Estimated: >60 mL/min (ref >60)
Glucose, Bld: 94 mg/dL (ref 70–99)
Potassium: 4.1 mmol/L (ref 3.5–5.1)
Sodium: 144 mmol/L (ref 135–145)
Total Bilirubin: 0.4 mg/dL (ref 0.3–1.2)
Total Protein: 6.4 g/dL — ABNORMAL LOW (ref 6.5–8.1)

## 2019-02-19 LAB — CBC WITH DIFFERENTIAL (CANCER CENTER ONLY)
Abs Immature Granulocytes: 0.01 10*3/uL (ref 0.00–0.07)
Basophils Absolute: 0 10*3/uL (ref 0.0–0.1)
Basophils Relative: 1 %
Eosinophils Absolute: 0.2 10*3/uL (ref 0.0–0.5)
Eosinophils Relative: 7 %
HCT: 37.3 % (ref 36.0–46.0)
Hemoglobin: 12.5 g/dL (ref 12.0–15.0)
Immature Granulocytes: 0 %
Lymphocytes Relative: 32 %
Lymphs Abs: 1 10*3/uL (ref 0.7–4.0)
MCH: 27.2 pg (ref 26.0–34.0)
MCHC: 33.5 g/dL (ref 30.0–36.0)
MCV: 81.3 fL (ref 80.0–100.0)
Monocytes Absolute: 0.6 10*3/uL (ref 0.1–1.0)
Monocytes Relative: 17 %
Neutro Abs: 1.3 10*3/uL — ABNORMAL LOW (ref 1.7–7.7)
Neutrophils Relative %: 43 %
Platelet Count: 145 10*3/uL — ABNORMAL LOW (ref 150–400)
RBC: 4.59 MIL/uL (ref 3.87–5.11)
RDW: 13 % (ref 11.5–15.5)
WBC Count: 3.2 10*3/uL — ABNORMAL LOW (ref 4.0–10.5)
nRBC: 0 % (ref 0.0–0.2)

## 2019-02-19 MED ORDER — DEXAMETHASONE 4 MG PO TABS: 4.0000 mg | ORAL_TABLET | Freq: Two times a day (BID) | ORAL | 0 refills | Status: DC

## 2019-02-19 MED ORDER — SODIUM CHLORIDE 0.9 % IV SOLN
2400.0000 mg/m2 | INTRAVENOUS | Status: DC
  Administered 2019-02-19: 4750 mg via INTRAVENOUS
  Filled 2019-02-19: qty 95

## 2019-02-19 MED ORDER — SODIUM CHLORIDE 0.9% FLUSH
10.0000 mL | Freq: Once | INTRAVENOUS | Status: AC
  Administered 2019-02-19: 09:00:00 10 mL
  Filled 2019-02-19: qty 10

## 2019-02-19 MED ORDER — OXALIPLATIN CHEMO INJECTION 100 MG/20ML
65.0000 mg/m2 | Freq: Once | INTRAVENOUS | Status: AC
  Administered 2019-02-19: 130 mg via INTRAVENOUS
  Filled 2019-02-19: qty 10

## 2019-02-19 MED ORDER — ACETAMINOPHEN 325 MG PO TABS
650.0000 mg | ORAL_TABLET | Freq: Once | ORAL | Status: AC
  Administered 2019-02-19: 650 mg via ORAL

## 2019-02-19 MED ORDER — FLUCONAZOLE 100 MG PO TABS: 100.0000 mg | ORAL_TABLET | Freq: Every day | ORAL | 0 refills | Status: DC

## 2019-02-19 MED ORDER — DEXTROSE 5 % IV SOLN
Freq: Once | INTRAVENOUS | Status: AC
  Administered 2019-02-19: 11:00:00 via INTRAVENOUS
  Filled 2019-02-19: qty 250

## 2019-02-19 MED ORDER — FLUOROURACIL CHEMO INJECTION 2.5 GM/50ML
400.0000 mg/m2 | Freq: Once | INTRAVENOUS | Status: AC
  Administered 2019-02-19: 800 mg via INTRAVENOUS
  Filled 2019-02-19: qty 16

## 2019-02-19 MED ORDER — PALONOSETRON HCL INJECTION 0.25 MG/5ML
0.2500 mg | Freq: Once | INTRAVENOUS | Status: AC
  Administered 2019-02-19: 0.25 mg via INTRAVENOUS

## 2019-02-19 MED ORDER — SODIUM CHLORIDE 0.9 % IV SOLN
Freq: Once | INTRAVENOUS | Status: AC
  Administered 2019-02-19: 11:00:00 via INTRAVENOUS
  Filled 2019-02-19: qty 5

## 2019-02-19 MED ORDER — ACETAMINOPHEN 325 MG PO TABS
ORAL_TABLET | ORAL | Status: AC
  Filled 2019-02-19: qty 2

## 2019-02-19 MED ORDER — LEUCOVORIN CALCIUM INJECTION 350 MG
400.0000 mg/m2 | Freq: Once | INTRAVENOUS | Status: AC
  Administered 2019-02-19: 788 mg via INTRAVENOUS
  Filled 2019-02-19: qty 39.4

## 2019-02-19 MED ORDER — PALONOSETRON HCL INJECTION 0.25 MG/5ML
INTRAVENOUS | Status: AC
  Filled 2019-02-19: qty 5

## 2019-02-19 NOTE — Progress Notes (Addendum)
[facility] OFFICE PROGRESS NOTE   Diagnosis:  Colon cancer  INTERVAL HISTORY:   Veronica Stuart returns as scheduled. She completed cycle 1 FOLFOX 02/05/2019.  She developed nausea/vomiting on day 1.  She received IV fluids and Decadron in the office on days 2 and 3.  The nausea persisted for about 7 days.  No mouth sores.  She developed diarrhea.  She then had constipation.  Bowels now moving regularly.  She notes an alteration in taste.  She wonders if this is due to the white coating on her tongue.  Cold sensitivity lasted about 3 days.  No numbness or tingling today.  Objective:  Vital signs in last 24 hours:  Blood pressure (!) 153/89, pulse 84, temperature 97.9 F (36.6 C), temperature source Oral, resp. rate 18, height 5\' 3"  (1.6 m), weight 187 lb 1.6 oz (84.9 kg), SpO2 100 %.    HEENT: Mild white coating over tongue.  No buccal thrush. Resp: Respirations even and unlabored. Vascular: No leg edema.  Skin: Palms without erythema. Port-A-Cath without erythema.   Lab Results:  Lab Results  Component Value Date   WBC 3.2 (L) 02/19/2019   HGB 12.5 02/19/2019   HCT 37.3 02/19/2019   MCV 81.3 02/19/2019   PLT 145 (L) 02/19/2019   NEUTROABS 1.3 (L) 02/19/2019    Imaging:  No results found.  Medications: I have reviewed the patient's current medications.  Assessment/Plan: 1. 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  Numerous soft tissue implants throughout the visualized lower mesentery with a dominant 4.4 cm right lower quadrant peritoneal implant.  Small volume pelvic ascites.  3.8 cm right ovarian mass.  01/15/2019-CEA 8   01/21/2019- CT biopsy of omental thickening left lower abdomen; pathology showed metastatic adenocarcinoma, positive for CDX-2, cytokeratin 20 and cytokeratin 7.  Tumor negative for GATA-3, NapsinA, PAX 8, TTF-1, WT-1 and ER.  CTs 01/29/2019- extensive omental caking, dominant mass at the cecum, mild ascites, mild left hydronephrosis and hydroureter, indeterminate pulmonary nodules, borderline prominent left subpectoral node and upper normal size right supraclavicular node, borderline prominent aortocaval node  Cycle 1 FOLFOX 02/05/2019  Cycle 2 FOLFOX 02/19/2019 (oxaliplatin dose reduced due to mild neutropenia) 2. Intermittent nausea/vomiting, abdominal pain-question obstructive symptoms 3. Hypothyroidism 4. Port-A-Cath placement 02/02/2019 5. Mild left hydronephrosis noted on CT 01/29/2019 6. Oral candidiasis.  She will complete a 4-day course of Diflucan.  Disposition: Veronica Stuart appears stable.  She has completed 1 cycle of FOLFOX.  She had significant nausea following treatment.  In addition to Aloxi and Decadron she will receive Emend with today's treatment.  She will begin Decadron 4 mg twice daily for 3 days on 02/20/2019.  She has Compazine and Ativan to take as needed.  She also has Zofran and is aware she should not take this for 72 hours after receiving Aloxi.  She will contact the office if she has nausea despite these measures.  She has mild neutropenia on labs today.  We are unable to obtain insurance authorization for white cell growth factor support with this cycle.  The oxaliplatin will be dose reduced with today's treatment.  She understands to contact the office with fever, chills, other signs of infection.  She will return for lab,  follow-up and cycle 3 FOLFOX in 2 weeks.  She will contact the office in the interim as outlined above or with any other problems.  Patient seen with Dr. Benay Spice.  25 minutes were spent face-to-face at today's visit with the majority of that time involved  in counseling/coordination of care.      Ned Card ANP/GNP-BC   02/19/2019  9:41 AM  This was a shared visit with Ned Card.  We adjusted the antiemetic regimen with this cycle of chemotherapy.  We discussed the risk of progressive neutropenia and infection following this cycle of chemotherapy.  She would like to proceed with chemotherapy today.  We are unable to obtain insurance approval for G-CSF.  The oxalic plan will be dose reduced with this cycle.  Veronica Manson, MD

## 2019-02-19 NOTE — Telephone Encounter (Signed)
Scheduled appt per 4/23 los. °

## 2019-02-19 NOTE — Progress Notes (Signed)
Per Ned Card NP, ok to treat with low ANC, dose reduction.  Pt c/o headache. Ned Card NP notified and orders received.

## 2019-02-19 NOTE — Patient Instructions (Signed)
[facility] Discharge Instructions for Patients Receiving Chemotherapy  Today you received the following chemotherapy agents Oxaliplatin, Leucovorin, Fluorouracil.  To help prevent nausea and vomiting after your treatment, we encourage you to take your nausea medication as directed.  If you develop nausea and vomiting that is not controlled by your nausea medication, call the clinic.   BELOW ARE SYMPTOMS THAT SHOULD BE REPORTED IMMEDIATELY:  *FEVER GREATER THAN 100.5 F  *CHILLS WITH OR WITHOUT FEVER  NAUSEA AND VOMITING THAT IS NOT CONTROLLED WITH YOUR NAUSEA MEDICATION  *UNUSUAL SHORTNESS OF BREATH  *UNUSUAL BRUISING OR BLEEDING  TENDERNESS IN MOUTH AND THROAT WITH OR WITHOUT PRESENCE OF ULCERS  *URINARY PROBLEMS  *BOWEL PROBLEMS  UNUSUAL RASH Items with * indicate a potential emergency and should be followed up as soon as possible.  Feel free to call the clinic should you have any questions or concerns. The clinic phone number is [phone].  Please show the [facility] at check-in to the Emergency Department and triage nurse.  Oxaliplatin Injection What is this medicine? OXALIPLATIN (ox AL i PLA tin) is a chemotherapy drug. It targets fast dividing cells, like cancer cells, and causes these cells to die. This medicine is used to treat cancers of the colon and rectum, and many other cancers. This medicine may be used for other purposes; ask your health care provider or pharmacist if you have questions. COMMON BRAND NAME(S): Eloxatin What should I tell my health care provider before I take this medicine? They need to know if you have any of these conditions: -kidney disease -an unusual or allergic reaction to oxaliplatin, other chemotherapy, other medicines, foods, dyes, or preservatives -pregnant or trying to get pregnant -breast-feeding How should I use this medicine? This drug is given as an infusion into a vein. It is administered in a hospital  or clinic by a specially trained health care professional. Talk to your pediatrician regarding the use of this medicine in children. Special care may be needed. Overdosage: If you think you have taken too much of this medicine contact a poison control center or emergency room at once. NOTE: This medicine is only for you. Do not share this medicine with others. What if I miss a dose? It is important not to miss a dose. Call your doctor or health care professional if you are unable to keep an appointment. What may interact with this medicine? -medicines to increase blood counts like filgrastim, pegfilgrastim, sargramostim -probenecid -some antibiotics like amikacin, gentamicin, neomycin, polymyxin B, streptomycin, tobramycin -zalcitabine Talk to your doctor or health care professional before taking any of these medicines: -acetaminophen -aspirin -ibuprofen -ketoprofen -naproxen This list may not describe all possible interactions. Give your health care provider a list of all the medicines, herbs, non-prescription drugs, or dietary supplements you use. Also tell them if you smoke, drink alcohol, or use illegal drugs. Some items may interact with your medicine. What should I watch for while using this medicine? Your condition will be monitored carefully while you are receiving this medicine. You will need important blood work done while you are taking this medicine. This medicine can make you more sensitive to cold. Do not drink cold drinks or use ice. Cover exposed skin before coming in contact with cold temperatures or cold objects. When out in cold weather wear warm clothing and cover your mouth and nose to warm the air that goes into your lungs. Tell your doctor if you get sensitive to the cold. This drug  may make you feel generally unwell. This is not uncommon, as chemotherapy can affect healthy cells as well as cancer cells. Report any side effects. Continue your course of treatment even  though you feel ill unless your doctor tells you to stop. In some cases, you may be given additional medicines to help with side effects. Follow all directions for their use. Call your doctor or health care professional for advice if you get a fever, chills or sore throat, or other symptoms of a cold or flu. Do not treat yourself. This drug decreases your body's ability to fight infections. Try to avoid being around people who are sick. This medicine may increase your risk to bruise or bleed. Call your doctor or health care professional if you notice any unusual bleeding. Be careful brushing and flossing your teeth or using a toothpick because you may get an infection or bleed more easily. If you have any dental work done, tell your dentist you are receiving this medicine. Avoid taking products that contain aspirin, acetaminophen, ibuprofen, naproxen, or ketoprofen unless instructed by your doctor. These medicines may hide a fever. Do not become pregnant while taking this medicine. Women should inform their doctor if they wish to become pregnant or think they might be pregnant. There is a potential for serious side effects to an unborn child. Talk to your health care professional or pharmacist for more information. Do not breast-feed an infant while taking this medicine. Call your doctor or health care professional if you get diarrhea. Do not treat yourself. What side effects may I notice from receiving this medicine? Side effects that you should report to your doctor or health care professional as soon as possible: -allergic reactions like skin rash, itching or hives, swelling of the face, lips, or tongue -low blood counts - This drug may decrease the number of white blood cells, red blood cells and platelets. You may be at increased risk for infections and bleeding. -signs of infection - fever or chills, cough, sore throat, pain or difficulty passing urine -signs of decreased platelets or bleeding -  bruising, pinpoint red spots on the skin, black, tarry stools, nosebleeds -signs of decreased red blood cells - unusually weak or tired, fainting spells, lightheadedness -breathing problems -chest pain, pressure -cough -diarrhea -jaw tightness -mouth sores -nausea and vomiting -pain, swelling, redness or irritation at the injection site -pain, tingling, numbness in the hands or feet -problems with balance, talking, walking -redness, blistering, peeling or loosening of the skin, including inside the mouth -trouble passing urine or change in the amount of urine Side effects that usually do not require medical attention (report to your doctor or health care professional if they continue or are bothersome): -changes in vision -constipation -hair loss -loss of appetite -metallic taste in the mouth or changes in taste -stomach pain This list may not describe all possible side effects. Call your doctor for medical advice about side effects. You may report side effects to FDA at 1-800-FDA-1088. Where should I keep my medicine? This drug is given in a hospital or clinic and will not be stored at home. NOTE: This sheet is a summary. It may not cover all possible information. If you have questions about this medicine, talk to your doctor, pharmacist, or health care provider.  2019 Elsevier/Gold Standard (2008-05-11 17:22:47)  Leucovorin injection What is this medicine? LEUCOVORIN (loo koe VOR in) is used to prevent or treat the harmful effects of some medicines. This medicine is used to treat anemia caused  by a low amount of folic acid in the body. It is also used with 5-fluorouracil (5-FU) to treat colon cancer. This medicine may be used for other purposes; ask your health care provider or pharmacist if you have questions. What should I tell my health care provider before I take this medicine? They need to know if you have any of these conditions: -anemia from low levels of vitamin B-12 in  the blood -an unusual or allergic reaction to leucovorin, folic acid, other medicines, foods, dyes, or preservatives -pregnant or trying to get pregnant -breast-feeding How should I use this medicine? This medicine is for injection into a muscle or into a vein. It is given by a health care professional in a hospital or clinic setting. Talk to your pediatrician regarding the use of this medicine in children. Special care may be needed. Overdosage: If you think you have taken too much of this medicine contact a poison control center or emergency room at once. NOTE: This medicine is only for you. Do not share this medicine with others. What if I miss a dose? This does not apply. What may interact with this medicine? -capecitabine -fluorouracil -phenobarbital -phenytoin -primidone -trimethoprim-sulfamethoxazole This list may not describe all possible interactions. Give your health care provider a list of all the medicines, herbs, non-prescription drugs, or dietary supplements you use. Also tell them if you smoke, drink alcohol, or use illegal drugs. Some items may interact with your medicine. What should I watch for while using this medicine? Your condition will be monitored carefully while you are receiving this medicine. This medicine may increase the side effects of 5-fluorouracil, 5-FU. Tell your doctor or health care professional if you have diarrhea or mouth sores that do not get better or that get worse. What side effects may I notice from receiving this medicine? Side effects that you should report to your doctor or health care professional as soon as possible: -allergic reactions like skin rash, itching or hives, swelling of the face, lips, or tongue -breathing problems -fever, infection -mouth sores -unusual bleeding or bruising -unusually weak or tired Side effects that usually do not require medical attention (report to your doctor or health care professional if they continue or  are bothersome): -constipation or diarrhea -loss of appetite -nausea, vomiting This list may not describe all possible side effects. Call your doctor for medical advice about side effects. You may report side effects to FDA at 1-800-FDA-1088. Where should I keep my medicine? This drug is given in a hospital or clinic and will not be stored at home. NOTE: This sheet is a summary. It may not cover all possible information. If you have questions about this medicine, talk to your doctor, pharmacist, or health care provider.  2019 Elsevier/Gold Standard (2008-04-20 16:50:29)   Fluorouracil, 5-FU injection What is this medicine? FLUOROURACIL, 5-FU (flure oh YOOR a sil) is a chemotherapy drug. It slows the growth of cancer cells. This medicine is used to treat many types of cancer like breast cancer, colon or rectal cancer, pancreatic cancer, and stomach cancer. This medicine may be used for other purposes; ask your health care provider or pharmacist if you have questions. COMMON BRAND NAME(S): Adrucil What should I tell my health care provider before I take this medicine? They need to know if you have any of these conditions: -blood disorders -dihydropyrimidine dehydrogenase (DPD) deficiency -infection (especially a virus infection such as chickenpox, cold sores, or herpes) -kidney disease -liver disease -malnourished, poor nutrition -recent or ongoing  radiation therapy -an unusual or allergic reaction to fluorouracil, other chemotherapy, other medicines, foods, dyes, or preservatives -pregnant or trying to get pregnant -breast-feeding How should I use this medicine? This drug is given as an infusion or injection into a vein. It is administered in a hospital or clinic by a specially trained health care professional. Talk to your pediatrician regarding the use of this medicine in children. Special care may be needed. Overdosage: If you think you have taken too much of this medicine contact a  poison control center or emergency room at once. NOTE: This medicine is only for you. Do not share this medicine with others. What if I miss a dose? It is important not to miss your dose. Call your doctor or health care professional if you are unable to keep an appointment. What may interact with this medicine? -allopurinol -cimetidine -dapsone -digoxin -hydroxyurea -leucovorin -levamisole -medicines for seizures like ethotoin, fosphenytoin, phenytoin -medicines to increase blood counts like filgrastim, pegfilgrastim, sargramostim -medicines that treat or prevent blood clots like warfarin, enoxaparin, and dalteparin -methotrexate -metronidazole -pyrimethamine -some other chemotherapy drugs like busulfan, cisplatin, estramustine, vinblastine -trimethoprim -trimetrexate -vaccines Talk to your doctor or health care professional before taking any of these medicines: -acetaminophen -aspirin -ibuprofen -ketoprofen -naproxen This list may not describe all possible interactions. Give your health care provider a list of all the medicines, herbs, non-prescription drugs, or dietary supplements you use. Also tell them if you smoke, drink alcohol, or use illegal drugs. Some items may interact with your medicine. What should I watch for while using this medicine? Visit your doctor for checks on your progress. This drug may make you feel generally unwell. This is not uncommon, as chemotherapy can affect healthy cells as well as cancer cells. Report any side effects. Continue your course of treatment even though you feel ill unless your doctor tells you to stop. In some cases, you may be given additional medicines to help with side effects. Follow all directions for their use. Call your doctor or health care professional for advice if you get a fever, chills or sore throat, or other symptoms of a cold or flu. Do not treat yourself. This drug decreases your body's ability to fight infections. Try to  avoid being around people who are sick. This medicine may increase your risk to bruise or bleed. Call your doctor or health care professional if you notice any unusual bleeding. Be careful brushing and flossing your teeth or using a toothpick because you may get an infection or bleed more easily. If you have any dental work done, tell your dentist you are receiving this medicine. Avoid taking products that contain aspirin, acetaminophen, ibuprofen, naproxen, or ketoprofen unless instructed by your doctor. These medicines may hide a fever. Do not become pregnant while taking this medicine. Women should inform their doctor if they wish to become pregnant or think they might be pregnant. There is a potential for serious side effects to an unborn child. Talk to your health care professional or pharmacist for more information. Do not breast-feed an infant while taking this medicine. Men should inform their doctor if they wish to father a child. This medicine may lower sperm counts. Do not treat diarrhea with over the counter products. Contact your doctor if you have diarrhea that lasts more than 2 days or if it is severe and watery. This medicine can make you more sensitive to the sun. Keep out of the sun. If you cannot avoid being in the sun, wear  protective clothing and use sunscreen. Do not use sun lamps or tanning beds/booths. What side effects may I notice from receiving this medicine? Side effects that you should report to your doctor or health care professional as soon as possible: -allergic reactions like skin rash, itching or hives, swelling of the face, lips, or tongue -low blood counts - this medicine may decrease the number of white blood cells, red blood cells and platelets. You may be at increased risk for infections and bleeding. -signs of infection - fever or chills, cough, sore throat, pain or difficulty passing urine -signs of decreased platelets or bleeding - bruising, pinpoint red spots  on the skin, black, tarry stools, blood in the urine -signs of decreased red blood cells - unusually weak or tired, fainting spells, lightheadedness -breathing problems -changes in vision -chest pain -mouth sores -nausea and vomiting -pain, swelling, redness at site where injected -pain, tingling, numbness in the hands or feet -redness, swelling, or sores on hands or feet -stomach pain -unusual bleeding Side effects that usually do not require medical attention (report to your doctor or health care professional if they continue or are bothersome): -changes in finger or toe nails -diarrhea -dry or itchy skin -hair loss -headache -loss of appetite -sensitivity of eyes to the light -stomach upset -unusually teary eyes This list may not describe all possible side effects. Call your doctor for medical advice about side effects. You may report side effects to FDA at 1-800-FDA-1088. Where should I keep my medicine? This drug is given in a hospital or clinic and will not be stored at home. NOTE: This sheet is a summary. It may not cover all possible information. If you have questions about this medicine, talk to your doctor, pharmacist, or health care provider.  2019 Elsevier/Gold Standard (2008-02-18 13:53:16)    Diarrhea, Adult Diarrhea is when you pass loose and watery poop (stool) often. Diarrhea can make you feel weak and cause you to lose water in your body (get dehydrated). Losing water in your body can cause you to:  Feel tired and thirsty.  Have a dry mouth.  Go pee (urinate) less often. Diarrhea often lasts 2-3 days. However, it can last longer if it is a sign of something more serious. It is important to treat your diarrhea as told by your doctor. Follow these instructions at home: Eating and drinking     Follow these instructions as told by your doctor:  Take an ORS (oral rehydration solution). This is a drink that helps you replace fluids and minerals your body  lost. It is sold at pharmacies and stores.  Drink plenty of fluids, such as: ? Water. ? Ice chips. ? Diluted fruit juice. ? Low-calorie sports drinks. ? Milk, if you want.  Avoid drinking fluids that have a lot of sugar or caffeine in them.  Eat bland, easy-to-digest foods in small amounts as you are able. These foods include: ? Bananas. ? Applesauce. ? Rice. ? Low-fat (lean) meats. ? Toast. ? Crackers.  Avoid alcohol.  Avoid spicy or fatty foods.  Medicines  Take over-the-counter and prescription medicines only as told by your doctor.  If you were prescribed an antibiotic medicine, take it as told by your doctor. Do not stop using the antibiotic even if you start to feel better. General instructions   Wash your hands often using soap and water. If soap and water are not available, use a hand sanitizer. Others in your home should wash their hands as well. Hands  should be washed: ? After using the toilet or changing a diaper. ? Before preparing, cooking, or serving food. ? While caring for a sick person. ? While visiting someone in a hospital.  Drink enough fluid to keep your pee (urine) pale yellow.  Rest at home while you get better.  Watch your condition for any changes.  Take a warm bath to help with any burning or pain from having diarrhea.  Keep all follow-up visits as told by your doctor. This is important. Contact a doctor if:  You have a fever.  Your diarrhea gets worse.  You have new symptoms.  You cannot keep fluids down.  You feel light-headed or dizzy.  You have a headache.  You have muscle cramps. Get help right away if:  You have chest pain.  You feel very weak or you pass out (faint).  You have bloody or black poop or poop that looks like tar.  You have very bad pain, cramping, or bloating in your belly (abdomen).  You have trouble breathing or you are breathing very quickly.  Your heart is beating very quickly.  Your skin  feels cold and clammy.  You feel confused.  You have signs of losing too much water in your body, such as: ? Dark pee, very little pee, or no pee. ? Cracked lips. ? Dry mouth. ? Sunken eyes. ? Sleepiness. ? Weakness. Summary  Diarrhea is when you pass loose and watery poop (stool) often.  Diarrhea can make you feel weak and cause you to lose water in your body (get dehydrated).  Take an ORS (oral rehydration solution). This is a drink that is sold at pharmacies and stores.  Eat bland, easy-to-digest foods in small amounts as you are able.  Contact a doctor if your condition gets worse. Get help right away if you have signs that you have lost too much water in your body. This information is not intended to replace advice given to you by your health care provider. Make sure you discuss any questions you have with your health care provider. Document Released: 04/02/2008 Document Revised: 03/21/2018 Document Reviewed: 03/21/2018 Elsevier Interactive Patient Education  2019 [employer] Inc.    Nausea, Adult Nausea is feeling sick to your stomach or feeling that you are about to throw up (vomit). Feeling sick to your stomach is usually not serious, but it may be an early sign of a more serious medical problem. As you feel sicker to your stomach, you may throw up. If you throw up, or if you are not able to drink enough fluids, there is a risk that you may lose too much water in your body (get dehydrated). If you lose too much water in your body, you may:  Feel tired.  Feel thirsty.  Have a dry mouth.  Have cracked lips.  Go pee (urinate) less often. Older adults and people who have other diseases or a weak body defense system (immune system) have a higher risk of losing too much water in the body. The main goals of treating this condition are:  To relieve your nausea.  To ensure your nausea occurs less often.  To prevent throwing up and losing too much fluid. Follow these  instructions at home: Watch your symptoms for any changes. Tell your doctor about them. Follow these instructions as told by your doctor. Eating and drinking      Take an ORS (oral rehydration solution). This is a drink that is sold at pharmacies and stores.  Drink clear fluids in small amounts as you are able. These include: ? Water. ? Ice chips. ? Fruit juice that has water added (diluted fruit juice). ? Low-calorie sports drinks.  Eat bland, easy-to-digest foods in small amounts as you are able, such as: ? Bananas. ? Applesauce. ? Rice. ? Low-fat (lean) meats. ? Toast. ? Crackers.  Avoid drinking fluids that have a lot of sugar or caffeine in them. This includes energy drinks, sports drinks, and soda.  Avoid alcohol.  Avoid spicy or fatty foods. General instructions  Take over-the-counter and prescription medicines only as told by your doctor.  Rest at home while you get better.  Drink enough fluid to keep your pee (urine) pale yellow.  Take slow and deep breaths when you feel sick to your stomach.  Avoid food or things that have strong smells.  Wash your hands often with soap and water. If you cannot use soap and water, use hand sanitizer.  Make sure that all people in your home wash their hands well and often.  Keep all follow-up visits as told by your doctor. This is important. Contact a doctor if:  You feel sicker to your stomach.  You feel sick to your stomach for more than 2 days.  You throw up.  You are not able to drink fluids without throwing up.  You have new symptoms.  You have a fever.  You have a headache.  You have muscle cramps.  You have a rash.  You have pain while peeing.  You feel light-headed or dizzy. Get help right away if:  You have pain in your chest, neck, arm, or jaw.  You feel very weak or you pass out (faint).  You have throw up that is bright red or looks like coffee grounds.  You have bloody or black poop  (stools) or poop that looks like tar.  You have a very bad headache, a stiff neck, or both.  You have very bad pain, cramping, or bloating in your belly (abdomen).  You have trouble breathing or you are breathing very quickly.  Your heart is beating very quickly.  Your skin feels cold and clammy.  You feel confused.  You have signs of losing too much water in your body, such as: ? Dark pee, very little pee, or no pee. ? Cracked lips. ? Dry mouth. ? Sunken eyes. ? Sleepiness. ? Weakness. These symptoms may be an emergency. Do not wait to see if the symptoms will go away. Get medical help right away. Call your local emergency services (911 in the [place] Do not drive yourself to the hospital. Summary  Nausea is feeling sick to your stomach or feeling that you are about to throw up (vomit).  If you throw up, or if you are not able to drink enough fluids, there is a risk that you may lose too much water in your body (get dehydrated).  Eat and drink what your doctor tells you. Take over-the-counter and prescription medicines only as told by your doctor.  Contact a doctor right away if your symptoms get worse or you have new symptoms.  Keep all follow-up visits as told by your doctor. This is important. This information is not intended to replace advice given to you by your health care provider. Make sure you discuss any questions you have with your health care provider. Document Released: 10/04/2011 Document Revised: 03/25/2018 Document Reviewed: 03/25/2018 Elsevier Interactive Patient Education  2019 [employer].

## 2019-02-20 ENCOUNTER — Other Ambulatory Visit: Payer: Self-pay | Admitting: Medical Oncology

## 2019-02-21 ENCOUNTER — Inpatient Hospital Stay: Payer: BC Managed Care – PPO

## 2019-02-21 ENCOUNTER — Other Ambulatory Visit: Payer: Self-pay

## 2019-02-21 VITALS — BP 151/78 | HR 85 | Temp 98.0°F | Resp 18

## 2019-02-21 DIAGNOSIS — C482 Malignant neoplasm of peritoneum, unspecified: Secondary | ICD-10-CM | POA: Diagnosis not present

## 2019-02-21 MED ORDER — SODIUM CHLORIDE 0.9% FLUSH
10.0000 mL | INTRAVENOUS | Status: DC | PRN
  Administered 2019-02-21: 10 mL
  Filled 2019-02-21: qty 10

## 2019-02-21 MED ORDER — HEPARIN SOD (PORK) LOCK FLUSH 100 UNIT/ML IV SOLN
500.0000 [IU] | Freq: Once | INTRAVENOUS | Status: AC | PRN
  Administered 2019-02-21: 500 [IU]
  Filled 2019-02-21: qty 5

## 2019-02-25 ENCOUNTER — Other Ambulatory Visit: Payer: Self-pay | Admitting: Family

## 2019-02-25 ENCOUNTER — Encounter: Payer: Self-pay | Admitting: *Deleted

## 2019-02-25 DIAGNOSIS — E039 Hypothyroidism, unspecified: Secondary | ICD-10-CM

## 2019-02-25 NOTE — Progress Notes (Signed)
Faxed last office note, labs, CT and MRI reports, pathology, chemo flowsheet to [facility] at [phone] at request of nurse, [facility]. Has appointment on 02/27/19 at 0900

## 2019-02-25 NOTE — Telephone Encounter (Signed)
Last TSH  11/05/17   Chrsity

## 2019-02-27 ENCOUNTER — Encounter: Payer: Self-pay | Admitting: Nurse Practitioner

## 2019-03-01 ENCOUNTER — Other Ambulatory Visit: Payer: Self-pay | Admitting: Oncology

## 2019-03-05 ENCOUNTER — Telehealth: Payer: Self-pay | Admitting: Oncology

## 2019-03-05 ENCOUNTER — Inpatient Hospital Stay (HOSPITAL_BASED_OUTPATIENT_CLINIC_OR_DEPARTMENT_OTHER): Payer: BC Managed Care – PPO | Admitting: Oncology

## 2019-03-05 ENCOUNTER — Inpatient Hospital Stay: Payer: BC Managed Care – PPO

## 2019-03-05 ENCOUNTER — Other Ambulatory Visit: Payer: Self-pay

## 2019-03-05 ENCOUNTER — Inpatient Hospital Stay: Payer: BC Managed Care – PPO | Attending: Gynecologic Oncology

## 2019-03-05 ENCOUNTER — Other Ambulatory Visit: Payer: Self-pay | Admitting: *Deleted

## 2019-03-05 VITALS — BP 155/89 | HR 86 | Temp 98.5°F | Resp 18 | Ht 63.0 in | Wt 191.1 lb

## 2019-03-05 DIAGNOSIS — C482 Malignant neoplasm of peritoneum, unspecified: Secondary | ICD-10-CM | POA: Insufficient documentation

## 2019-03-05 DIAGNOSIS — R918 Other nonspecific abnormal finding of lung field: Secondary | ICD-10-CM

## 2019-03-05 DIAGNOSIS — R188 Other ascites: Secondary | ICD-10-CM | POA: Diagnosis not present

## 2019-03-05 DIAGNOSIS — R112 Nausea with vomiting, unspecified: Secondary | ICD-10-CM | POA: Insufficient documentation

## 2019-03-05 DIAGNOSIS — N839 Noninflammatory disorder of ovary, fallopian tube and broad ligament, unspecified: Secondary | ICD-10-CM | POA: Insufficient documentation

## 2019-03-05 DIAGNOSIS — C786 Secondary malignant neoplasm of retroperitoneum and peritoneum: Secondary | ICD-10-CM | POA: Diagnosis not present

## 2019-03-05 DIAGNOSIS — R109 Unspecified abdominal pain: Secondary | ICD-10-CM | POA: Diagnosis not present

## 2019-03-05 DIAGNOSIS — N1330 Unspecified hydronephrosis: Secondary | ICD-10-CM

## 2019-03-05 DIAGNOSIS — C800 Disseminated malignant neoplasm, unspecified: Secondary | ICD-10-CM

## 2019-03-05 DIAGNOSIS — Z79899 Other long term (current) drug therapy: Secondary | ICD-10-CM | POA: Diagnosis not present

## 2019-03-05 DIAGNOSIS — B370 Candidal stomatitis: Secondary | ICD-10-CM

## 2019-03-05 DIAGNOSIS — Z95828 Presence of other vascular implants and grafts: Secondary | ICD-10-CM

## 2019-03-05 DIAGNOSIS — D6959 Other secondary thrombocytopenia: Secondary | ICD-10-CM

## 2019-03-05 DIAGNOSIS — C799 Secondary malignant neoplasm of unspecified site: Secondary | ICD-10-CM

## 2019-03-05 DIAGNOSIS — D709 Neutropenia, unspecified: Secondary | ICD-10-CM | POA: Insufficient documentation

## 2019-03-05 DIAGNOSIS — Z5111 Encounter for antineoplastic chemotherapy: Secondary | ICD-10-CM | POA: Diagnosis not present

## 2019-03-05 LAB — CMP (CANCER CENTER ONLY)
ALT: 91 U/L — ABNORMAL HIGH (ref 0–44)
AST: 74 U/L — ABNORMAL HIGH (ref 15–41)
Albumin: 3.5 g/dL (ref 3.5–5.0)
Alkaline Phosphatase: 120 U/L (ref 38–126)
Anion gap: 8 (ref 5–15)
BUN: 13 mg/dL (ref 6–20)
CO2: 26 mmol/L (ref 22–32)
Calcium: 8.6 mg/dL — ABNORMAL LOW (ref 8.9–10.3)
Chloride: 107 mmol/L (ref 98–111)
Creatinine: 0.77 mg/dL (ref 0.44–1.00)
GFR, Est AFR Am: >60 mL/min (ref >60)
GFR, Estimated: >60 mL/min (ref >60)
Glucose, Bld: 100 mg/dL — ABNORMAL HIGH (ref 70–99)
Potassium: 4.3 mmol/L (ref 3.5–5.1)
Sodium: 141 mmol/L (ref 135–145)
Total Bilirubin: 0.4 mg/dL (ref 0.3–1.2)
Total Protein: 6.3 g/dL — ABNORMAL LOW (ref 6.5–8.1)

## 2019-03-05 LAB — CBC WITH DIFFERENTIAL (CANCER CENTER ONLY)
Abs Immature Granulocytes: 0.03 10*3/uL (ref 0.00–0.07)
Basophils Absolute: 0 10*3/uL (ref 0.0–0.1)
Basophils Relative: 1 %
Eosinophils Absolute: 0.1 10*3/uL (ref 0.0–0.5)
Eosinophils Relative: 2 %
HCT: 38 % (ref 36.0–46.0)
Hemoglobin: 12.7 g/dL (ref 12.0–15.0)
Immature Granulocytes: 1 %
Lymphocytes Relative: 42 %
Lymphs Abs: 1.2 10*3/uL (ref 0.7–4.0)
MCH: 28.2 pg (ref 26.0–34.0)
MCHC: 33.4 g/dL (ref 30.0–36.0)
MCV: 84.4 fL (ref 80.0–100.0)
Monocytes Absolute: 0.7 10*3/uL (ref 0.1–1.0)
Monocytes Relative: 22 %
Neutro Abs: 0.9 10*3/uL — ABNORMAL LOW (ref 1.7–7.7)
Neutrophils Relative %: 32 %
Platelet Count: 100 10*3/uL — ABNORMAL LOW (ref 150–400)
RBC: 4.5 MIL/uL (ref 3.87–5.11)
RDW: 15.6 % — ABNORMAL HIGH (ref 11.5–15.5)
WBC Count: 2.9 10*3/uL — ABNORMAL LOW (ref 4.0–10.5)
nRBC: 0 % (ref 0.0–0.2)

## 2019-03-05 MED ORDER — SODIUM CHLORIDE 0.9% FLUSH
10.0000 mL | INTRAVENOUS | Status: DC | PRN
  Administered 2019-03-05: 10 mL via INTRAVENOUS
  Filled 2019-03-05: qty 10

## 2019-03-05 MED ORDER — SODIUM CHLORIDE 0.9% FLUSH
10.0000 mL | Freq: Once | INTRAVENOUS | Status: AC
  Administered 2019-03-05: 10 mL
  Filled 2019-03-05: qty 10

## 2019-03-05 MED ORDER — HEPARIN SOD (PORK) LOCK FLUSH 100 UNIT/ML IV SOLN
500.0000 [IU] | Freq: Once | INTRAVENOUS | Status: AC
  Administered 2019-03-05: 500 [IU] via INTRAVENOUS
  Filled 2019-03-05: qty 5

## 2019-03-05 MED ORDER — DEXAMETHASONE 4 MG PO TABS: 4.0000 mg | ORAL_TABLET | Freq: Two times a day (BID) | ORAL | 5 refills | Status: AC

## 2019-03-05 NOTE — Patient Instructions (Signed)

## 2019-03-05 NOTE — Telephone Encounter (Signed)
Scheduled appt per 5/7 los. °

## 2019-03-05 NOTE — Progress Notes (Signed)
[place] OFFICE PROGRESS NOTE   Diagnosis: Metastatic adenocarcinoma  INTERVAL HISTORY:   Veronica Stuart completed another cycle of FOLFOX on 02/19/2019.  She noted less nausea following this cycle of chemotherapy.  She took Decadron prophylaxis at home. She has tingling and cold sensitivity in the hands a few days following chemotherapy.  This has resolved.  No mouth sores or diarrhea.  No abdominal pain. She reports intermittent frontal headaches, similar to headaches she had in the past when her blood pressure was elevated.  She does not relate the headaches to taking ondansetron.  Objective:  Vital signs in last 24 hours:  Blood pressure (!) 155/89, pulse 86, temperature 98.5 F (36.9 C), temperature source Oral, resp. rate 18, height 5\' 3"  (1.6 m), weight 191 lb 1.6 oz (86.7 kg), SpO2 100 %.    HEENT: No thrush or ulcers GI: Soft and nontender, no mass Vascular: No leg edema    Portacath/PICC-without erythema  Lab Results:  Lab Results  Component Value Date   WBC 2.9 (L) 03/05/2019   HGB 12.7 03/05/2019   HCT 38.0 03/05/2019   MCV 84.4 03/05/2019   PLT 100 (L) 03/05/2019   NEUTROABS 0.9 (L) 03/05/2019    CMP  Lab Results  Component Value Date   NA 141 03/05/2019   K 4.3 03/05/2019   CL 107 03/05/2019   CO2 26 03/05/2019   GLUCOSE 100 (H) 03/05/2019   BUN 13 03/05/2019   CREATININE 0.77 03/05/2019   CALCIUM 8.6 (L) 03/05/2019   PROT 6.3 (L) 03/05/2019   ALBUMIN 3.5 03/05/2019   AST 74 (H) 03/05/2019   ALT 91 (H) 03/05/2019   ALKPHOS 120 03/05/2019   BILITOT 0.4 03/05/2019   GFRNONAA >60 03/05/2019   GFRAA >60 03/05/2019    Lab Results  Component Value Date   CEA1 8.03 (H) 01/15/2019    Medications: I have reviewed the patient's current medications.   Assessment/Plan: 1. 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  Numerous soft tissue implants throughout the visualized lower mesentery with a dominant 4.4 cm right lower quadrant peritoneal implant.  Small volume pelvic ascites.  3.8 cm right ovarian mass.  01/15/2019-CEA 8  01/21/2019- CT biopsy of omental thickening left lower abdomen; pathology showed metastatic adenocarcinoma, positive for CDX-2, cytokeratin 20 and cytokeratin 7.  Tumor negative for GATA-3, NapsinA, PAX 8, TTF-1, WT-1 and ER.  CTs 01/29/2019- extensive omental caking, dominant mass at the cecum, mild ascites, mild left hydronephrosis and hydroureter, indeterminate pulmonary nodules, borderline prominent left subpectoral node and upper normal size right supraclavicular node, borderline prominent aortocaval node  Cycle 1 FOLFOX 02/05/2019  Cycle 2 FOLFOX 02/19/2019 (oxaliplatin dose reduced due to mild neutropenia) 2. Intermittent nausea/vomiting, abdominal pain-question obstructive symptoms 3. Hypothyroidism 4. Port-A-Cath placement 02/02/2019 5. Mild left hydronephrosis noted on CT 01/29/2019 6. Oral candidiasis.  She will complete a 4-day course of Diflucan. 7. Neutropenia/thrombocytopenia secondary to chemotherapy- G-CSF support added with cycle 3 FOLFOX, oxaliplatin dose reduced beginning with cycle 2    Disposition: She tolerated the last cycle of chemotherapy with less nausea.  She will continue home Decadron prophylaxis.  She has neutropenia and thrombocytopenia today.  Chemotherapy will be held today.  G-CSF support will be added  with the next cycle of chemotherapy.  She will call for a fever or bleeding.  Veronica Stuart is scheduled to see Dr. Clovis Riley on 03/11/2019.  She will return for cycle 3 FOLFOX on 03/12/2019.  She will be scheduled for an office visit and chemotherapy on  03/26/2019.  Veronica Coder, MD  03/05/2019  2:00 PM

## 2019-03-07 ENCOUNTER — Inpatient Hospital Stay: Payer: BC Managed Care – PPO

## 2019-03-10 NOTE — Addendum Note (Signed)
Addended by: Tora Kindred on: 03/10/2019 09:09 AM   Modules accepted: Orders

## 2019-03-12 ENCOUNTER — Other Ambulatory Visit: Payer: Self-pay

## 2019-03-12 ENCOUNTER — Other Ambulatory Visit: Payer: Self-pay | Admitting: Oncology

## 2019-03-12 ENCOUNTER — Inpatient Hospital Stay: Payer: BC Managed Care – PPO

## 2019-03-12 ENCOUNTER — Encounter: Payer: Self-pay | Admitting: Oncology

## 2019-03-12 VITALS — BP 134/92 | HR 90 | Temp 98.6°F | Resp 18 | Wt 192.0 lb

## 2019-03-12 DIAGNOSIS — C799 Secondary malignant neoplasm of unspecified site: Secondary | ICD-10-CM

## 2019-03-12 DIAGNOSIS — Z95828 Presence of other vascular implants and grafts: Secondary | ICD-10-CM

## 2019-03-12 DIAGNOSIS — C482 Malignant neoplasm of peritoneum, unspecified: Secondary | ICD-10-CM | POA: Diagnosis not present

## 2019-03-12 LAB — CBC WITH DIFFERENTIAL (CANCER CENTER ONLY)
Abs Immature Granulocytes: 0.08 10*3/uL — ABNORMAL HIGH (ref 0.00–0.07)
Basophils Absolute: 0.1 10*3/uL (ref 0.0–0.1)
Basophils Relative: 1 %
Eosinophils Absolute: 0.1 10*3/uL (ref 0.0–0.5)
Eosinophils Relative: 2 %
HCT: 38.6 % (ref 36.0–46.0)
Hemoglobin: 12.6 g/dL (ref 12.0–15.0)
Immature Granulocytes: 2 %
Lymphocytes Relative: 33 %
Lymphs Abs: 1.7 10*3/uL (ref 0.7–4.0)
MCH: 27.2 pg (ref 26.0–34.0)
MCHC: 32.6 g/dL (ref 30.0–36.0)
MCV: 83.4 fL (ref 80.0–100.0)
Monocytes Absolute: 0.6 10*3/uL (ref 0.1–1.0)
Monocytes Relative: 12 %
Neutro Abs: 2.7 10*3/uL (ref 1.7–7.7)
Neutrophils Relative %: 50 %
Platelet Count: 164 10*3/uL (ref 150–400)
RBC: 4.63 MIL/uL (ref 3.87–5.11)
RDW: 15.9 % — ABNORMAL HIGH (ref 11.5–15.5)
WBC Count: 5.3 10*3/uL (ref 4.0–10.5)
nRBC: 0 % (ref 0.0–0.2)

## 2019-03-12 LAB — CMP (CANCER CENTER ONLY)
ALT: 50 U/L — ABNORMAL HIGH (ref 0–44)
AST: 35 U/L (ref 15–41)
Albumin: 3.5 g/dL (ref 3.5–5.0)
Alkaline Phosphatase: 131 U/L — ABNORMAL HIGH (ref 38–126)
Anion gap: 9 (ref 5–15)
BUN: 13 mg/dL (ref 6–20)
CO2: 25 mmol/L (ref 22–32)
Calcium: 8.5 mg/dL — ABNORMAL LOW (ref 8.9–10.3)
Chloride: 105 mmol/L (ref 98–111)
Creatinine: 0.8 mg/dL (ref 0.44–1.00)
GFR, Est AFR Am: >60 mL/min (ref >60)
GFR, Estimated: >60 mL/min (ref >60)
Glucose, Bld: 122 mg/dL — ABNORMAL HIGH (ref 70–99)
Potassium: 3.9 mmol/L (ref 3.5–5.1)
Sodium: 139 mmol/L (ref 135–145)
Total Bilirubin: 0.3 mg/dL (ref 0.3–1.2)
Total Protein: 6.5 g/dL (ref 6.5–8.1)

## 2019-03-12 MED ORDER — DEXTROSE 5 % IV SOLN
Freq: Once | INTRAVENOUS | Status: AC
  Administered 2019-03-12: 12:00:00 via INTRAVENOUS
  Filled 2019-03-12: qty 250

## 2019-03-12 MED ORDER — SODIUM CHLORIDE 0.9 % IV SOLN
Freq: Once | INTRAVENOUS | Status: AC
  Administered 2019-03-12: 13:00:00 via INTRAVENOUS
  Filled 2019-03-12: qty 5

## 2019-03-12 MED ORDER — SODIUM CHLORIDE 0.9% FLUSH
10.0000 mL | INTRAVENOUS | Status: DC | PRN
  Administered 2019-03-12: 10 mL
  Filled 2019-03-12: qty 10

## 2019-03-12 MED ORDER — OXALIPLATIN CHEMO INJECTION 100 MG/20ML
65.0000 mg/m2 | Freq: Once | INTRAVENOUS | Status: AC
  Administered 2019-03-12: 14:00:00 130 mg via INTRAVENOUS
  Filled 2019-03-12: qty 20

## 2019-03-12 MED ORDER — PALONOSETRON HCL INJECTION 0.25 MG/5ML
INTRAVENOUS | Status: AC
  Filled 2019-03-12: qty 5

## 2019-03-12 MED ORDER — FLUOROURACIL CHEMO INJECTION 2.5 GM/50ML
400.0000 mg/m2 | Freq: Once | INTRAVENOUS | Status: AC
  Administered 2019-03-12: 16:00:00 800 mg via INTRAVENOUS
  Filled 2019-03-12: qty 16

## 2019-03-12 MED ORDER — DEXTROSE 5 % IV SOLN
Freq: Once | INTRAVENOUS | Status: AC
  Administered 2019-03-12: 13:00:00 via INTRAVENOUS
  Filled 2019-03-12: qty 250

## 2019-03-12 MED ORDER — LEUCOVORIN CALCIUM INJECTION 350 MG
400.0000 mg/m2 | Freq: Once | INTRAVENOUS | Status: AC
  Administered 2019-03-12: 788 mg via INTRAVENOUS
  Filled 2019-03-12: qty 39.4

## 2019-03-12 MED ORDER — PALONOSETRON HCL INJECTION 0.25 MG/5ML
0.2500 mg | Freq: Once | INTRAVENOUS | Status: AC
  Administered 2019-03-12: 13:00:00 0.25 mg via INTRAVENOUS

## 2019-03-12 MED ORDER — SODIUM CHLORIDE 0.9 % IV SOLN
2400.0000 mg/m2 | INTRAVENOUS | Status: DC
  Administered 2019-03-12: 16:00:00 4750 mg via INTRAVENOUS
  Filled 2019-03-12: qty 95

## 2019-03-12 MED ORDER — SODIUM CHLORIDE 0.9% FLUSH
10.0000 mL | Freq: Once | INTRAVENOUS | Status: AC
  Administered 2019-03-12: 10 mL
  Filled 2019-03-12: qty 10

## 2019-03-12 NOTE — Progress Notes (Signed)
Enrolled patient in [place] for Neulasta as it is listed in treatment plan.  Patient approved for maximum amount of $10,000 for the calendar year 03/12/19-10/29/19 with the first one covered at 100% after insurance pays and each additional injection would only leave her with a $5 copay after her insurance pays.  Copy of approval letter will be mailed to patient for her records only.  Called [employer](Aaliya) to obtain virtual credit card information and provided to [facility] for billing/copay submissions.

## 2019-03-12 NOTE — Patient Instructions (Signed)
[facility] Discharge Instructions for Patients Receiving Chemotherapy  Today you received the following chemotherapy agents Oxaliplatin, Leucovorin, Fluorouracil.  To help prevent nausea and vomiting after your treatment, we encourage you to take your nausea medication as directed.  If you develop nausea and vomiting that is not controlled by your nausea medication, call the clinic.   BELOW ARE SYMPTOMS THAT SHOULD BE REPORTED IMMEDIATELY:  *FEVER GREATER THAN 100.5 F  *CHILLS WITH OR WITHOUT FEVER  NAUSEA AND VOMITING THAT IS NOT CONTROLLED WITH YOUR NAUSEA MEDICATION  *UNUSUAL SHORTNESS OF BREATH  *UNUSUAL BRUISING OR BLEEDING  TENDERNESS IN MOUTH AND THROAT WITH OR WITHOUT PRESENCE OF ULCERS  *URINARY PROBLEMS  *BOWEL PROBLEMS  UNUSUAL RASH Items with * indicate a potential emergency and should be followed up as soon as possible.  Feel free to call the clinic should you have any questions or concerns. The clinic phone number is [phone].  Please show the [place] [facility] at check-in to the Emergency Department and triage nurse.  Oxaliplatin Injection What is this medicine? OXALIPLATIN (ox AL i PLA tin) is a chemotherapy drug. It targets fast dividing cells, like cancer cells, and causes these cells to die. This medicine is used to treat cancers of the colon and rectum, and many other cancers. This medicine may be used for other purposes; ask your health care provider or pharmacist if you have questions. COMMON BRAND NAME(S): Eloxatin What should I tell my health care provider before I take this medicine? They need to know if you have any of these conditions: -kidney disease -an unusual or allergic reaction to oxaliplatin, other chemotherapy, other medicines, foods, dyes, or preservatives -pregnant or trying to get pregnant -breast-feeding How should I use this medicine? This drug is given as an infusion into a vein. It is administered in a hospital  or clinic by a specially trained health care professional. Talk to your pediatrician regarding the use of this medicine in children. Special care may be needed. Overdosage: If you think you have taken too much of this medicine contact a poison control center or emergency room at once. NOTE: This medicine is only for you. Do not share this medicine with others. What if I miss a dose? It is important not to miss a dose. Call your doctor or health care professional if you are unable to keep an appointment. What may interact with this medicine? -medicines to increase blood counts like filgrastim, pegfilgrastim, sargramostim -probenecid -some antibiotics like amikacin, gentamicin, neomycin, polymyxin B, streptomycin, tobramycin -zalcitabine Talk to your doctor or health care professional before taking any of these medicines: -acetaminophen -aspirin -ibuprofen -ketoprofen -naproxen This list may not describe all possible interactions. Give your health care provider a list of all the medicines, herbs, non-prescription drugs, or dietary supplements you use. Also tell them if you smoke, drink alcohol, or use illegal drugs. Some items may interact with your medicine. What should I watch for while using this medicine? Your condition will be monitored carefully while you are receiving this medicine. You will need important blood work done while you are taking this medicine. This medicine can make you more sensitive to cold. Do not drink cold drinks or use ice. Cover exposed skin before coming in contact with cold temperatures or cold objects. When out in cold weather wear warm clothing and cover your mouth and nose to warm the air that goes into your lungs. Tell your doctor if you get sensitive to the cold. This drug  may make you feel generally unwell. This is not uncommon, as chemotherapy can affect healthy cells as well as cancer cells. Report any side effects. Continue your course of treatment even  though you feel ill unless your doctor tells you to stop. In some cases, you may be given additional medicines to help with side effects. Follow all directions for their use. Call your doctor or health care professional for advice if you get a fever, chills or sore throat, or other symptoms of a cold or flu. Do not treat yourself. This drug decreases your body's ability to fight infections. Try to avoid being around people who are sick. This medicine may increase your risk to bruise or bleed. Call your doctor or health care professional if you notice any unusual bleeding. Be careful brushing and flossing your teeth or using a toothpick because you may get an infection or bleed more easily. If you have any dental work done, tell your dentist you are receiving this medicine. Avoid taking products that contain aspirin, acetaminophen, ibuprofen, naproxen, or ketoprofen unless instructed by your doctor. These medicines may hide a fever. Do not become pregnant while taking this medicine. Women should inform their doctor if they wish to become pregnant or think they might be pregnant. There is a potential for serious side effects to an unborn child. Talk to your health care professional or pharmacist for more information. Do not breast-feed an infant while taking this medicine. Call your doctor or health care professional if you get diarrhea. Do not treat yourself. What side effects may I notice from receiving this medicine? Side effects that you should report to your doctor or health care professional as soon as possible: -allergic reactions like skin rash, itching or hives, swelling of the face, lips, or tongue -low blood counts - This drug may decrease the number of white blood cells, red blood cells and platelets. You may be at increased risk for infections and bleeding. -signs of infection - fever or chills, cough, sore throat, pain or difficulty passing urine -signs of decreased platelets or bleeding -  bruising, pinpoint red spots on the skin, black, tarry stools, nosebleeds -signs of decreased red blood cells - unusually weak or tired, fainting spells, lightheadedness -breathing problems -chest pain, pressure -cough -diarrhea -jaw tightness -mouth sores -nausea and vomiting -pain, swelling, redness or irritation at the injection site -pain, tingling, numbness in the hands or feet -problems with balance, talking, walking -redness, blistering, peeling or loosening of the skin, including inside the mouth -trouble passing urine or change in the amount of urine Side effects that usually do not require medical attention (report to your doctor or health care professional if they continue or are bothersome): -changes in vision -constipation -hair loss -loss of appetite -metallic taste in the mouth or changes in taste -stomach pain This list may not describe all possible side effects. Call your doctor for medical advice about side effects. You may report side effects to FDA at 1-800-FDA-1088. Where should I keep my medicine? This drug is given in a hospital or clinic and will not be stored at home. NOTE: This sheet is a summary. It may not cover all possible information. If you have questions about this medicine, talk to your doctor, pharmacist, or health care provider.  2019 Elsevier/Gold Standard (2008-05-11 17:22:47)  Leucovorin injection What is this medicine? LEUCOVORIN (loo koe VOR in) is used to prevent or treat the harmful effects of some medicines. This medicine is used to treat anemia caused  by a low amount of folic acid in the body. It is also used with 5-fluorouracil (5-FU) to treat colon cancer. This medicine may be used for other purposes; ask your health care provider or pharmacist if you have questions. What should I tell my health care provider before I take this medicine? They need to know if you have any of these conditions: -anemia from low levels of vitamin B-12 in  the blood -an unusual or allergic reaction to leucovorin, folic acid, other medicines, foods, dyes, or preservatives -pregnant or trying to get pregnant -breast-feeding How should I use this medicine? This medicine is for injection into a muscle or into a vein. It is given by a health care professional in a hospital or clinic setting. Talk to your pediatrician regarding the use of this medicine in children. Special care may be needed. Overdosage: If you think you have taken too much of this medicine contact a poison control center or emergency room at once. NOTE: This medicine is only for you. Do not share this medicine with others. What if I miss a dose? This does not apply. What may interact with this medicine? -capecitabine -fluorouracil -phenobarbital -phenytoin -primidone -trimethoprim-sulfamethoxazole This list may not describe all possible interactions. Give your health care provider a list of all the medicines, herbs, non-prescription drugs, or dietary supplements you use. Also tell them if you smoke, drink alcohol, or use illegal drugs. Some items may interact with your medicine. What should I watch for while using this medicine? Your condition will be monitored carefully while you are receiving this medicine. This medicine may increase the side effects of 5-fluorouracil, 5-FU. Tell your doctor or health care professional if you have diarrhea or mouth sores that do not get better or that get worse. What side effects may I notice from receiving this medicine? Side effects that you should report to your doctor or health care professional as soon as possible: -allergic reactions like skin rash, itching or hives, swelling of the face, lips, or tongue -breathing problems -fever, infection -mouth sores -unusual bleeding or bruising -unusually weak or tired Side effects that usually do not require medical attention (report to your doctor or health care professional if they continue or  are bothersome): -constipation or diarrhea -loss of appetite -nausea, vomiting This list may not describe all possible side effects. Call your doctor for medical advice about side effects. You may report side effects to FDA at 1-800-FDA-1088. Where should I keep my medicine? This drug is given in a hospital or clinic and will not be stored at home. NOTE: This sheet is a summary. It may not cover all possible information. If you have questions about this medicine, talk to your doctor, pharmacist, or health care provider.  2019 Elsevier/Gold Standard (2008-04-20 16:50:29)   Fluorouracil, 5-FU injection What is this medicine? FLUOROURACIL, 5-FU (flure oh YOOR a sil) is a chemotherapy drug. It slows the growth of cancer cells. This medicine is used to treat many types of cancer like breast cancer, colon or rectal cancer, pancreatic cancer, and stomach cancer. This medicine may be used for other purposes; ask your health care provider or pharmacist if you have questions. COMMON BRAND NAME(S): Adrucil What should I tell my health care provider before I take this medicine? They need to know if you have any of these conditions: -blood disorders -dihydropyrimidine dehydrogenase (DPD) deficiency -infection (especially a virus infection such as chickenpox, cold sores, or herpes) -kidney disease -liver disease -malnourished, poor nutrition -recent or ongoing  radiation therapy -an unusual or allergic reaction to fluorouracil, other chemotherapy, other medicines, foods, dyes, or preservatives -pregnant or trying to get pregnant -breast-feeding How should I use this medicine? This drug is given as an infusion or injection into a vein. It is administered in a hospital or clinic by a specially trained health care professional. Talk to your pediatrician regarding the use of this medicine in children. Special care may be needed. Overdosage: If you think you have taken too much of this medicine contact a  poison control center or emergency room at once. NOTE: This medicine is only for you. Do not share this medicine with others. What if I miss a dose? It is important not to miss your dose. Call your doctor or health care professional if you are unable to keep an appointment. What may interact with this medicine? -allopurinol -cimetidine -dapsone -digoxin -hydroxyurea -leucovorin -levamisole -medicines for seizures like ethotoin, fosphenytoin, phenytoin -medicines to increase blood counts like filgrastim, pegfilgrastim, sargramostim -medicines that treat or prevent blood clots like warfarin, enoxaparin, and dalteparin -methotrexate -metronidazole -pyrimethamine -some other chemotherapy drugs like busulfan, cisplatin, estramustine, vinblastine -trimethoprim -trimetrexate -vaccines Talk to your doctor or health care professional before taking any of these medicines: -acetaminophen -aspirin -ibuprofen -ketoprofen -naproxen This list may not describe all possible interactions. Give your health care provider a list of all the medicines, herbs, non-prescription drugs, or dietary supplements you use. Also tell them if you smoke, drink alcohol, or use illegal drugs. Some items may interact with your medicine. What should I watch for while using this medicine? Visit your doctor for checks on your progress. This drug may make you feel generally unwell. This is not uncommon, as chemotherapy can affect healthy cells as well as cancer cells. Report any side effects. Continue your course of treatment even though you feel ill unless your doctor tells you to stop. In some cases, you may be given additional medicines to help with side effects. Follow all directions for their use. Call your doctor or health care professional for advice if you get a fever, chills or sore throat, or other symptoms of a cold or flu. Do not treat yourself. This drug decreases your body's ability to fight infections. Try to  avoid being around people who are sick. This medicine may increase your risk to bruise or bleed. Call your doctor or health care professional if you notice any unusual bleeding. Be careful brushing and flossing your teeth or using a toothpick because you may get an infection or bleed more easily. If you have any dental work done, tell your dentist you are receiving this medicine. Avoid taking products that contain aspirin, acetaminophen, ibuprofen, naproxen, or ketoprofen unless instructed by your doctor. These medicines may hide a fever. Do not become pregnant while taking this medicine. Women should inform their doctor if they wish to become pregnant or think they might be pregnant. There is a potential for serious side effects to an unborn child. Talk to your health care professional or pharmacist for more information. Do not breast-feed an infant while taking this medicine. Men should inform their doctor if they wish to father a child. This medicine may lower sperm counts. Do not treat diarrhea with over the counter products. Contact your doctor if you have diarrhea that lasts more than 2 days or if it is severe and watery. This medicine can make you more sensitive to the sun. Keep out of the sun. If you cannot avoid being in the sun, wear  protective clothing and use sunscreen. Do not use sun lamps or tanning beds/booths. What side effects may I notice from receiving this medicine? Side effects that you should report to your doctor or health care professional as soon as possible: -allergic reactions like skin rash, itching or hives, swelling of the face, lips, or tongue -low blood counts - this medicine may decrease the number of white blood cells, red blood cells and platelets. You may be at increased risk for infections and bleeding. -signs of infection - fever or chills, cough, sore throat, pain or difficulty passing urine -signs of decreased platelets or bleeding - bruising, pinpoint red spots  on the skin, black, tarry stools, blood in the urine -signs of decreased red blood cells - unusually weak or tired, fainting spells, lightheadedness -breathing problems -changes in vision -chest pain -mouth sores -nausea and vomiting -pain, swelling, redness at site where injected -pain, tingling, numbness in the hands or feet -redness, swelling, or sores on hands or feet -stomach pain -unusual bleeding Side effects that usually do not require medical attention (report to your doctor or health care professional if they continue or are bothersome): -changes in finger or toe nails -diarrhea -dry or itchy skin -hair loss -headache -loss of appetite -sensitivity of eyes to the light -stomach upset -unusually teary eyes This list may not describe all possible side effects. Call your doctor for medical advice about side effects. You may report side effects to FDA at 1-800-FDA-1088. Where should I keep my medicine? This drug is given in a hospital or clinic and will not be stored at home. NOTE: This sheet is a summary. It may not cover all possible information. If you have questions about this medicine, talk to your doctor, pharmacist, or health care provider.  2019 Elsevier/Gold Standard (2008-02-18 13:53:16)    Diarrhea, Adult Diarrhea is when you pass loose and watery poop (stool) often. Diarrhea can make you feel weak and cause you to lose water in your body (get dehydrated). Losing water in your body can cause you to:  Feel tired and thirsty.  Have a dry mouth.  Go pee (urinate) less often. Diarrhea often lasts 2-3 days. However, it can last longer if it is a sign of something more serious. It is important to treat your diarrhea as told by your doctor. Follow these instructions at home: Eating and drinking     Follow these instructions as told by your doctor:  Take an ORS (oral rehydration solution). This is a drink that helps you replace fluids and minerals your body  lost. It is sold at pharmacies and stores.  Drink plenty of fluids, such as: ? Water. ? Ice chips. ? Diluted fruit juice. ? Low-calorie sports drinks. ? Milk, if you want.  Avoid drinking fluids that have a lot of sugar or caffeine in them.  Eat bland, easy-to-digest foods in small amounts as you are able. These foods include: ? Bananas. ? Applesauce. ? Rice. ? Low-fat (lean) meats. ? Toast. ? Crackers.  Avoid alcohol.  Avoid spicy or fatty foods.  Medicines  Take over-the-counter and prescription medicines only as told by your doctor.  If you were prescribed an antibiotic medicine, take it as told by your doctor. Do not stop using the antibiotic even if you start to feel better. General instructions   Wash your hands often using soap and water. If soap and water are not available, use a hand sanitizer. Others in your home should wash their hands as well. Hands  should be washed: ? After using the toilet or changing a diaper. ? Before preparing, cooking, or serving food. ? While caring for a sick person. ? While visiting someone in a hospital.  Drink enough fluid to keep your pee (urine) pale yellow.  Rest at home while you get better.  Watch your condition for any changes.  Take a warm bath to help with any burning or pain from having diarrhea.  Keep all follow-up visits as told by your doctor. This is important. Contact a doctor if:  You have a fever.  Your diarrhea gets worse.  You have new symptoms.  You cannot keep fluids down.  You feel light-headed or dizzy.  You have a headache.  You have muscle cramps. Get help right away if:  You have chest pain.  You feel very weak or you pass out (faint).  You have bloody or black poop or poop that looks like tar.  You have very bad pain, cramping, or bloating in your belly (abdomen).  You have trouble breathing or you are breathing very quickly.  Your heart is beating very quickly.  Your skin  feels cold and clammy.  You feel confused.  You have signs of losing too much water in your body, such as: ? Dark pee, very little pee, or no pee. ? Cracked lips. ? Dry mouth. ? Sunken eyes. ? Sleepiness. ? Weakness. Summary  Diarrhea is when you pass loose and watery poop (stool) often.  Diarrhea can make you feel weak and cause you to lose water in your body (get dehydrated).  Take an ORS (oral rehydration solution). This is a drink that is sold at pharmacies and stores.  Eat bland, easy-to-digest foods in small amounts as you are able.  Contact a doctor if your condition gets worse. Get help right away if you have signs that you have lost too much water in your body. This information is not intended to replace advice given to you by your health care provider. Make sure you discuss any questions you have with your health care provider. Document Released: 04/02/2008 Document Revised: 03/21/2018 Document Reviewed: 03/21/2018 Elsevier Interactive Patient Education  2019 [employer] Inc[employer]    Nausea, Adult Nausea is feeling sick to your stomach or feeling that you are about to throw up (vomit). Feeling sick to your stomach is usually not serious, but it may be an early sign of a more serious medical problem. As you feel sicker to your stomach, you may throw up. If you throw up, or if you are not able to drink enough fluids, there is a risk that you may lose too much water in your body (get dehydrated). If you lose too much water in your body, you may:  Feel tired.  Feel thirsty.  Have a dry mouth.  Have cracked lips.  Go pee (urinate) less often. Older adults and people who have other diseases or a weak body defense system (immune system) have a higher risk of losing too much water in the body. The main goals of treating this condition are:  To relieve your nausea.  To ensure your nausea occurs less often.  To prevent throwing up and losing too much fluid. Follow these  instructions at home: Watch your symptoms for any changes. Tell your doctor about them. Follow these instructions as told by your doctor. Eating and drinking      Take an ORS (oral rehydration solution). This is a drink that is sold at pharmacies and stores.  Drink clear fluids in small amounts as you are able. These include: ? Water. ? Ice chips. ? Fruit juice that has water added (diluted fruit juice). ? Low-calorie sports drinks.  Eat bland, easy-to-digest foods in small amounts as you are able, such as: ? Bananas. ? Applesauce. ? Rice. ? Low-fat (lean) meats. ? Toast. ? Crackers.  Avoid drinking fluids that have a lot of sugar or caffeine in them. This includes energy drinks, sports drinks, and soda.  Avoid alcohol.  Avoid spicy or fatty foods. General instructions  Take over-the-counter and prescription medicines only as told by your doctor.  Rest at home while you get better.  Drink enough fluid to keep your pee (urine) pale yellow.  Take slow and deep breaths when you feel sick to your stomach.  Avoid food or things that have strong smells.  Wash your hands often with soap and water. If you cannot use soap and water, use hand sanitizer.  Make sure that all people in your home wash their hands well and often.  Keep all follow-up visits as told by your doctor. This is important. Contact a doctor if:  You feel sicker to your stomach.  You feel sick to your stomach for more than 2 days.  You throw up.  You are not able to drink fluids without throwing up.  You have new symptoms.  You have a fever.  You have a headache.  You have muscle cramps.  You have a rash.  You have pain while peeing.  You feel light-headed or dizzy. Get help right away if:  You have pain in your chest, neck, arm, or jaw.  You feel very weak or you pass out (faint).  You have throw up that is bright red or looks like coffee grounds.  You have bloody or black poop  (stools) or poop that looks like tar.  You have a very bad headache, a stiff neck, or both.  You have very bad pain, cramping, or bloating in your belly (abdomen).  You have trouble breathing or you are breathing very quickly.  Your heart is beating very quickly.  Your skin feels cold and clammy.  You feel confused.  You have signs of losing too much water in your body, such as: ? Dark pee, very little pee, or no pee. ? Cracked lips. ? Dry mouth. ? Sunken eyes. ? Sleepiness. ? Weakness. These symptoms may be an emergency. Do not wait to see if the symptoms will go away. Get medical help right away. Call your local emergency services (911 in the [place] Do not drive yourself to the hospital. Summary  Nausea is feeling sick to your stomach or feeling that you are about to throw up (vomit).  If you throw up, or if you are not able to drink enough fluids, there is a risk that you may lose too much water in your body (get dehydrated).  Eat and drink what your doctor tells you. Take over-the-counter and prescription medicines only as told by your doctor.  Contact a doctor right away if your symptoms get worse or you have new symptoms.  Keep all follow-up visits as told by your doctor. This is important. This information is not intended to replace advice given to you by your health care provider. Make sure you discuss any questions you have with your health care provider. Document Released: 10/04/2011 Document Revised: 03/25/2018 Document Reviewed: 03/25/2018 Elsevier Interactive Patient Education  2019 [employer].

## 2019-03-12 NOTE — Progress Notes (Signed)
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  Discussed the one-time $100 [place] and qualifications to assist with personal expenses while going through treatment.   Gave application for [employer] whom assist patients whom live in [place] with expenses as well.  Discussed copay assistance for Neulasta if she will be receiving.  Gave my card if interested in applying and for any additional financial questions or concerns. She verbalized understanding.

## 2019-03-14 ENCOUNTER — Other Ambulatory Visit: Payer: Self-pay

## 2019-03-14 ENCOUNTER — Inpatient Hospital Stay: Payer: BC Managed Care – PPO

## 2019-03-14 VITALS — BP 140/83 | HR 77 | Temp 98.5°F | Resp 17

## 2019-03-14 DIAGNOSIS — Z95828 Presence of other vascular implants and grafts: Secondary | ICD-10-CM

## 2019-03-14 DIAGNOSIS — C799 Secondary malignant neoplasm of unspecified site: Secondary | ICD-10-CM

## 2019-03-14 DIAGNOSIS — C482 Malignant neoplasm of peritoneum, unspecified: Secondary | ICD-10-CM | POA: Diagnosis not present

## 2019-03-14 MED ORDER — SODIUM CHLORIDE 0.9% FLUSH
10.0000 mL | Freq: Once | INTRAVENOUS | Status: AC
  Administered 2019-03-14: 10 mL
  Filled 2019-03-14: qty 10

## 2019-03-14 MED ORDER — HEPARIN SOD (PORK) LOCK FLUSH 100 UNIT/ML IV SOLN
500.0000 [IU] | Freq: Once | INTRAVENOUS | Status: AC
  Administered 2019-03-14: 500 [IU]
  Filled 2019-03-14: qty 5

## 2019-03-14 MED ORDER — PEGFILGRASTIM INJECTION 6 MG/0.6ML ~~LOC~~
6.0000 mg | PREFILLED_SYRINGE | Freq: Once | SUBCUTANEOUS | Status: AC
  Administered 2019-03-14: 13:00:00 6 mg via SUBCUTANEOUS

## 2019-03-14 MED ORDER — PEGFILGRASTIM INJECTION 6 MG/0.6ML ~~LOC~~
PREFILLED_SYRINGE | SUBCUTANEOUS | Status: AC
  Filled 2019-03-14: qty 0.6

## 2019-03-14 NOTE — Patient Instructions (Signed)
Pegfilgrastim injection  What is this medicine?  PEGFILGRASTIM (PEG fil gra stim) is a long-acting granulocyte colony-stimulating factor that stimulates the growth of neutrophils, a type of white blood cell important in the body's fight against infection. It is used to reduce the incidence of fever and infection in patients with certain types of cancer who are receiving chemotherapy that affects the bone marrow, and to increase survival after being exposed to high doses of radiation.  This medicine may be used for other purposes; ask your health care provider or pharmacist if you have questions.  COMMON BRAND NAME(S): Fulphila, Neulasta, UDENYCA  What should I tell my health care provider before I take this medicine?  They need to know if you have any of these conditions:  -kidney disease  -latex allergy  -ongoing radiation therapy  -sickle cell disease  -skin reactions to acrylic adhesives (On-Body Injector only)  -an unusual or allergic reaction to pegfilgrastim, filgrastim, other medicines, foods, dyes, or preservatives  -pregnant or trying to get pregnant  -breast-feeding  How should I use this medicine?  This medicine is for injection under the skin. If you get this medicine at home, you will be taught how to prepare and give the pre-filled syringe or how to use the On-body Injector. Refer to the patient Instructions for Use for detailed instructions. Use exactly as directed. Tell your healthcare provider immediately if you suspect that the On-body Injector may not have performed as intended or if you suspect the use of the On-body Injector resulted in a missed or partial dose.  It is important that you put your used needles and syringes in a special sharps container. Do not put them in a trash can. If you do not have a sharps container, call your pharmacist or healthcare provider to get one.  Talk to your pediatrician regarding the use of this medicine in children. While this drug may be prescribed for  selected conditions, precautions do apply.  Overdosage: If you think you have taken too much of this medicine contact a poison control center or emergency room at once.  NOTE: This medicine is only for you. Do not share this medicine with others.  What if I miss a dose?  It is important not to miss your dose. Call your doctor or health care professional if you miss your dose. If you miss a dose due to an On-body Injector failure or leakage, a new dose should be administered as soon as possible using a single prefilled syringe for manual use.  What may interact with this medicine?  Interactions have not been studied.  Give your health care provider a list of all the medicines, herbs, non-prescription drugs, or dietary supplements you use. Also tell them if you smoke, drink alcohol, or use illegal drugs. Some items may interact with your medicine.  This list may not describe all possible interactions. Give your health care provider a list of all the medicines, herbs, non-prescription drugs, or dietary supplements you use. Also tell them if you smoke, drink alcohol, or use illegal drugs. Some items may interact with your medicine.  What should I watch for while using this medicine?  You may need blood work done while you are taking this medicine.  If you are going to need a MRI, CT scan, or other procedure, tell your doctor that you are using this medicine (On-Body Injector only).  What side effects may I notice from receiving this medicine?  Side effects that you should report to   your doctor or health care professional as soon as possible:  -allergic reactions like skin rash, itching or hives, swelling of the face, lips, or tongue  -back pain  -dizziness  -fever  -pain, redness, or irritation at site where injected  -pinpoint red spots on the skin  -red or dark-brown urine  -shortness of breath or breathing problems  -stomach or side pain, or pain at the shoulder  -swelling  -tiredness  -trouble passing urine or  change in the amount of urine  Side effects that usually do not require medical attention (report to your doctor or health care professional if they continue or are bothersome):  -bone pain  -muscle pain  This list may not describe all possible side effects. Call your doctor for medical advice about side effects. You may report side effects to FDA at 1-800-FDA-1088.  Where should I keep my medicine?  Keep out of the reach of children.  If you are using this medicine at home, you will be instructed on how to store it. Throw away any unused medicine after the expiration date on the label.  NOTE: This sheet is a summary. It may not cover all possible information. If you have questions about this medicine, talk to your doctor, pharmacist, or health care provider.   2019 Elsevier/Gold Standard (2018-01-20 16:57:08)

## 2019-03-18 ENCOUNTER — Encounter: Payer: Self-pay | Admitting: Oncology

## 2019-03-22 ENCOUNTER — Other Ambulatory Visit: Payer: Self-pay | Admitting: Family Medicine

## 2019-03-22 DIAGNOSIS — E039 Hypothyroidism, unspecified: Secondary | ICD-10-CM

## 2019-03-23 ENCOUNTER — Other Ambulatory Visit: Payer: Self-pay | Admitting: Oncology

## 2019-03-24 MED ORDER — LEVOTHYROXINE SODIUM 88 MCG PO TABS: ORAL_TABLET | ORAL | 0 refills | Status: DC

## 2019-03-24 NOTE — Telephone Encounter (Signed)
[place]. NTBS 30 days given 02/25/19

## 2019-03-24 NOTE — Addendum Note (Signed)
Addended by: Karle Plumber on: 03/24/2019 03:23 PM   Modules accepted: Orders

## 2019-03-24 NOTE — Telephone Encounter (Signed)
Patient aware and verbalizes understanding - states that she is going through chemo right now and would like to know if we can send her in another one month supply until her oncologist checks her TSH - states they are checking it to avoid patient having to come in for another office visit- covering PCP- please advise

## 2019-03-24 NOTE — Telephone Encounter (Signed)
rx sent - patient aware

## 2019-03-24 NOTE — Telephone Encounter (Signed)
Go ahead and call in 1 more month for her

## 2019-03-26 ENCOUNTER — Other Ambulatory Visit: Payer: Self-pay

## 2019-03-26 ENCOUNTER — Encounter: Payer: Self-pay | Admitting: Nurse Practitioner

## 2019-03-26 ENCOUNTER — Encounter: Payer: Self-pay | Admitting: Oncology

## 2019-03-26 ENCOUNTER — Inpatient Hospital Stay: Payer: BC Managed Care – PPO

## 2019-03-26 ENCOUNTER — Inpatient Hospital Stay (HOSPITAL_BASED_OUTPATIENT_CLINIC_OR_DEPARTMENT_OTHER): Payer: BC Managed Care – PPO | Admitting: Nurse Practitioner

## 2019-03-26 VITALS — BP 155/94 | HR 78 | Temp 98.5°F | Resp 18 | Ht 63.0 in | Wt 194.0 lb

## 2019-03-26 DIAGNOSIS — C799 Secondary malignant neoplasm of unspecified site: Secondary | ICD-10-CM

## 2019-03-26 DIAGNOSIS — N839 Noninflammatory disorder of ovary, fallopian tube and broad ligament, unspecified: Secondary | ICD-10-CM | POA: Diagnosis not present

## 2019-03-26 DIAGNOSIS — D6959 Other secondary thrombocytopenia: Secondary | ICD-10-CM

## 2019-03-26 DIAGNOSIS — R188 Other ascites: Secondary | ICD-10-CM | POA: Diagnosis not present

## 2019-03-26 DIAGNOSIS — D709 Neutropenia, unspecified: Secondary | ICD-10-CM

## 2019-03-26 DIAGNOSIS — Z95828 Presence of other vascular implants and grafts: Secondary | ICD-10-CM

## 2019-03-26 DIAGNOSIS — B370 Candidal stomatitis: Secondary | ICD-10-CM

## 2019-03-26 DIAGNOSIS — C482 Malignant neoplasm of peritoneum, unspecified: Secondary | ICD-10-CM | POA: Diagnosis not present

## 2019-03-26 DIAGNOSIS — R112 Nausea with vomiting, unspecified: Secondary | ICD-10-CM

## 2019-03-26 DIAGNOSIS — C786 Secondary malignant neoplasm of retroperitoneum and peritoneum: Secondary | ICD-10-CM | POA: Diagnosis not present

## 2019-03-26 DIAGNOSIS — R918 Other nonspecific abnormal finding of lung field: Secondary | ICD-10-CM

## 2019-03-26 DIAGNOSIS — R109 Unspecified abdominal pain: Secondary | ICD-10-CM

## 2019-03-26 DIAGNOSIS — N1330 Unspecified hydronephrosis: Secondary | ICD-10-CM

## 2019-03-26 DIAGNOSIS — Z79899 Other long term (current) drug therapy: Secondary | ICD-10-CM

## 2019-03-26 LAB — CBC WITH DIFFERENTIAL (CANCER CENTER ONLY)
Abs Immature Granulocytes: 0.3 10*3/uL — ABNORMAL HIGH (ref 0.00–0.07)
Basophils Absolute: 0.1 10*3/uL (ref 0.0–0.1)
Basophils Relative: 1 %
Eosinophils Absolute: 0.2 10*3/uL (ref 0.0–0.5)
Eosinophils Relative: 2 %
HCT: 39.1 % (ref 36.0–46.0)
Hemoglobin: 13 g/dL (ref 12.0–15.0)
Immature Granulocytes: 4 %
Lymphocytes Relative: 24 %
Lymphs Abs: 1.7 10*3/uL (ref 0.7–4.0)
MCH: 28.4 pg (ref 26.0–34.0)
MCHC: 33.2 g/dL (ref 30.0–36.0)
MCV: 85.4 fL (ref 80.0–100.0)
Monocytes Absolute: 0.6 10*3/uL (ref 0.1–1.0)
Monocytes Relative: 9 %
Neutro Abs: 4.3 10*3/uL (ref 1.7–7.7)
Neutrophils Relative %: 60 %
Platelet Count: 126 10*3/uL — ABNORMAL LOW (ref 150–400)
RBC: 4.58 MIL/uL (ref 3.87–5.11)
RDW: 17.2 % — ABNORMAL HIGH (ref 11.5–15.5)
WBC Count: 7.2 10*3/uL (ref 4.0–10.5)
nRBC: 0 % (ref 0.0–0.2)

## 2019-03-26 LAB — CMP (CANCER CENTER ONLY)
ALT: 34 U/L (ref 0–44)
AST: 36 U/L (ref 15–41)
Albumin: 3.5 g/dL (ref 3.5–5.0)
Alkaline Phosphatase: 144 U/L — ABNORMAL HIGH (ref 38–126)
Anion gap: 9 (ref 5–15)
BUN: 13 mg/dL (ref 6–20)
CO2: 25 mmol/L (ref 22–32)
Calcium: 8.7 mg/dL — ABNORMAL LOW (ref 8.9–10.3)
Chloride: 106 mmol/L (ref 98–111)
Creatinine: 0.8 mg/dL (ref 0.44–1.00)
GFR, Est AFR Am: >60 mL/min (ref >60)
GFR, Estimated: >60 mL/min (ref >60)
Glucose, Bld: 109 mg/dL — ABNORMAL HIGH (ref 70–99)
Potassium: 3.7 mmol/L (ref 3.5–5.1)
Sodium: 140 mmol/L (ref 135–145)
Total Bilirubin: 0.4 mg/dL (ref 0.3–1.2)
Total Protein: 6.5 g/dL (ref 6.5–8.1)

## 2019-03-26 MED ORDER — SODIUM CHLORIDE 0.9 % IV SOLN
2400.0000 mg/m2 | INTRAVENOUS | Status: DC
  Administered 2019-03-26: 17:00:00 4750 mg via INTRAVENOUS
  Filled 2019-03-26: qty 95

## 2019-03-26 MED ORDER — LEUCOVORIN CALCIUM INJECTION 350 MG
400.0000 mg/m2 | Freq: Once | INTRAVENOUS | Status: AC
  Administered 2019-03-26: 14:00:00 788 mg via INTRAVENOUS
  Filled 2019-03-26: qty 39.4

## 2019-03-26 MED ORDER — HEPARIN SOD (PORK) LOCK FLUSH 100 UNIT/ML IV SOLN
500.0000 [IU] | Freq: Once | INTRAVENOUS | Status: DC | PRN
  Filled 2019-03-26: qty 5

## 2019-03-26 MED ORDER — SODIUM CHLORIDE 0.9% FLUSH
10.0000 mL | Freq: Once | INTRAVENOUS | Status: AC
  Administered 2019-03-26: 10 mL
  Filled 2019-03-26: qty 10

## 2019-03-26 MED ORDER — DEXTROSE 5 % IV SOLN
Freq: Once | INTRAVENOUS | Status: AC
  Administered 2019-03-26: 13:00:00 via INTRAVENOUS
  Filled 2019-03-26: qty 250

## 2019-03-26 MED ORDER — SODIUM CHLORIDE 0.9 % IV SOLN
Freq: Once | INTRAVENOUS | Status: AC
  Administered 2019-03-26: 13:00:00 via INTRAVENOUS
  Filled 2019-03-26: qty 5

## 2019-03-26 MED ORDER — PALONOSETRON HCL INJECTION 0.25 MG/5ML
0.2500 mg | Freq: Once | INTRAVENOUS | Status: AC
  Administered 2019-03-26: 13:00:00 0.25 mg via INTRAVENOUS

## 2019-03-26 MED ORDER — SODIUM CHLORIDE 0.9% FLUSH
10.0000 mL | INTRAVENOUS | Status: DC | PRN
  Filled 2019-03-26: qty 10

## 2019-03-26 MED ORDER — FLUOROURACIL CHEMO INJECTION 2.5 GM/50ML
400.0000 mg/m2 | Freq: Once | INTRAVENOUS | Status: AC
  Administered 2019-03-26: 17:00:00 800 mg via INTRAVENOUS
  Filled 2019-03-26: qty 16

## 2019-03-26 MED ORDER — OXALIPLATIN CHEMO INJECTION 100 MG/20ML
65.0000 mg/m2 | Freq: Once | INTRAVENOUS | Status: AC
  Administered 2019-03-26: 14:00:00 130 mg via INTRAVENOUS
  Filled 2019-03-26: qty 20

## 2019-03-26 MED ORDER — PALONOSETRON HCL INJECTION 0.25 MG/5ML
INTRAVENOUS | Status: AC
  Filled 2019-03-26: qty 5

## 2019-03-26 NOTE — Patient Instructions (Signed)
Cancer Center Discharge Instructions for Patients Receiving Chemotherapy  Today you received the following chemotherapy agents: Oxaliplatin, Leucovorin, Fluorouracil  To help prevent nausea and vomiting after your treatment, we encourage you to take your nausea medication as directed by your MD.   If you develop nausea and vomiting that is not controlled by your nausea medication, call the clinic.   BELOW ARE SYMPTOMS THAT SHOULD BE REPORTED IMMEDIATELY:  *FEVER GREATER THAN 100.5 F  *CHILLS WITH OR WITHOUT FEVER  NAUSEA AND VOMITING THAT IS NOT CONTROLLED WITH YOUR NAUSEA MEDICATION  *UNUSUAL SHORTNESS OF BREATH  *UNUSUAL BRUISING OR BLEEDING  TENDERNESS IN MOUTH AND THROAT WITH OR WITHOUT PRESENCE OF ULCERS  *URINARY PROBLEMS  *BOWEL PROBLEMS  UNUSUAL RASH Items with * indicate a potential emergency and should be followed up as soon as possible.  Feel free to call the clinic should you have any questions or concerns. The clinic phone number is [phone].  Please show the CHEMO ALERT CARD at check-in to the Emergency Department and triage nurse.  Coronavirus (COVID-19) Are you at risk?  Are you at risk for the Coronavirus (COVID-19)?  To be considered HIGH RISK for Coronavirus (COVID-19), you have to meet the following criteria:  . Traveled to [place], [place], [place], [place] or [place]; or in the [place] to [place], [place], [place], or [place]; and have fever, cough, and shortness of breath within the last 2 weeks of travel OR . Been in close contact with a person diagnosed with COVID-19 within the last 2 weeks and have fever, cough, and shortness of breath . IF YOU DO NOT MEET THESE CRITERIA, YOU ARE CONSIDERED LOW RISK FOR COVID-19.  What to do if you are HIGH RISK for COVID-19?  . If you are having a medical emergency, call 911. . Seek medical care right away. Before you go to a doctor's office, urgent care or emergency department,  call ahead and tell them about your recent travel, contact with someone diagnosed with COVID-19, and your symptoms. You should receive instructions from your physician's office regarding next steps of care.  . When you arrive at healthcare provider, tell the healthcare staff immediately you have returned from visiting [place], [place], [place], [place] or [place]; or traveled in the [place] to [place], [place], [place], or [place]; in the last two weeks or you have been in close contact with a person diagnosed with COVID-19 in the last 2 weeks.   . Tell the health care staff about your symptoms: fever, cough and shortness of breath. . After you have been seen by a medical provider, you will be either: o Tested for (COVID-19) and discharged home on quarantine except to seek medical care if symptoms worsen, and asked to  - Stay home and avoid contact with others until you get your results (4-5 days)  - Avoid travel on public transportation if possible (such as bus, train, or airplane) or o Sent to the Emergency Department by EMS for evaluation, COVID-19 testing, and possible admission depending on your condition and test results.  What to do if you are LOW RISK for COVID-19?  Reduce your risk of any infection by using the same precautions used for avoiding the common cold or flu:  . Wash your hands often with soap and warm water for at least 20 seconds.  If soap and water are not readily available, use an alcohol-based hand sanitizer with at least 60% alcohol.  .   If coughing or sneezing, cover your mouth and nose by coughing or sneezing into the elbow areas of your shirt or coat, into a tissue or into your sleeve (not your hands). . Avoid shaking hands with others and consider head nods or verbal greetings only. . Avoid touching your eyes, nose, or mouth with unwashed hands.  . Avoid close contact with people who are sick. . Avoid places or events with large numbers of people in one  location, like concerts or sporting events. . Carefully consider travel plans you have or are making. . If you are planning any travel outside or inside the [place], visit the CDC's Travelers' Health webpage for the latest health notices. . If you have some symptoms but not all symptoms, continue to monitor at home and seek medical attention if your symptoms worsen. . If you are having a medical emergency, call 911.   ADDITIONAL HEALTHCARE OPTIONS FOR PATIENTS  [facility] / e-Visit: [email or web address]         [facility]: [phone]  [facility]: [phone]                   [facility]: [phone]  

## 2019-03-26 NOTE — Progress Notes (Signed)
  [facility] OFFICE PROGRESS NOTE   Diagnosis: Metastatic adenocarcinoma  INTERVAL HISTORY:   Veronica Stuart returns as scheduled.  She completed cycle 3 FOLFOX 03/12/2019.  She had significantly less nausea than with previous cycles.  No mouth sores.  No significant diarrhea.  No numbness or tingling in the hands or feet.  Objective:  Vital signs in last 24 hours:  Blood pressure (!) 155/94, pulse 78, temperature 98.5 F (36.9 C), temperature source Tympanic, resp. rate 18, height 5\' 3"  (1.6 m), weight 194 lb (88 kg), SpO2 100 %.    Vascular: No leg edema. Neuro: Vibratory sense intact over the fingertips per tuning fork exam. Skin: Palms with mild erythema. Port-A-Cath without erythema.   Lab Results:  Lab Results  Component Value Date   WBC 7.2 03/26/2019   HGB 13.0 03/26/2019   HCT 39.1 03/26/2019   MCV 85.4 03/26/2019   PLT 126 (L) 03/26/2019   NEUTROABS 4.3 03/26/2019    Imaging:  No results found.  Medications: I have reviewed the patient's current medications.  Assessment/Plan: 1. 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 Numerous soft tissue implants throughout the visualized lower mesentery with a dominant 4.4 cm right lower quadrant peritoneal implant. Small volume pelvic ascites. 3.8 cm right ovarian mass.  01/15/2019-CEA 8  01/21/2019- CT biopsy of omental thickening left lower abdomen; pathology showed metastatic adenocarcinoma, positive for CDX-2, cytokeratin 20 and cytokeratin 7. Tumor negative for GATA-3, NapsinA, PAX 8, TTF-1, WT-1 and ER.  CTs 01/29/2019- extensive omental caking, dominant mass at the cecum,  mild ascites, mild left hydronephrosis and hydroureter, indeterminate pulmonary nodules, borderline prominent left subpectoral node and upper normal size right supraclavicular node, borderline prominent aortocaval node  Cycle 1 FOLFOX 02/05/2019  Cycle 2 FOLFOX 02/19/2019 (oxaliplatin dose reduced due to mild neutropenia)  Cycle 3 FOLFOX 03/12/2019, Neulasta added  Cycle 4 FOLFOX 03/26/2019 2. Intermittent nausea/vomiting, abdominal pain-question obstructive symptoms 3. Hypothyroidism 4. Port-A-Cath placement 02/02/2019 5. Mild left hydronephrosis noted on CT 01/29/2019 6. Oral candidiasis.  She will complete a 4-day course of Diflucan. 7. Neutropenia/thrombocytopenia secondary to chemotherapy- G-CSF support added with cycle 3 FOLFOX, oxaliplatin dose reduced beginning with cycle 2   Disposition: Veronica Stuart appears stable.  She has completed 3 cycles of FOLFOX.  Plan to proceed with cycle 4 today as scheduled.  She will again receive Neulasta on the day of pump discontinuation.  We reviewed the CBC from today.  Counts are adequate for treatment.  She will return for lab, follow-up, cycle 5 FOLFOX in 2 weeks.  She will contact the office in the interim with any problems.    Ned Card ANP/GNP-BC   03/26/2019  12:19 PM

## 2019-03-26 NOTE — Progress Notes (Signed)
Met with patient whom brought proof of income for [place].  Patient approved for one-time $700 [place] to assist with personal expenses while going through treatment. Patient given a copy of the approval letter as well as the expense sheet along with the Outpatient pharmacy information. Went over in detail expenses and how they are covered and mentioned the mailbox for dropping expenses. She verbalized understanding.   She has my card for any additional financial questions or concerns.

## 2019-03-27 ENCOUNTER — Telehealth: Payer: Self-pay | Admitting: Nurse Practitioner

## 2019-03-27 NOTE — Telephone Encounter (Signed)
Scheduled appt per 5/28 los. °

## 2019-03-28 ENCOUNTER — Other Ambulatory Visit: Payer: Self-pay

## 2019-03-28 ENCOUNTER — Inpatient Hospital Stay: Payer: BC Managed Care – PPO

## 2019-03-28 VITALS — BP 141/83 | HR 70 | Temp 98.7°F | Resp 18

## 2019-03-28 DIAGNOSIS — C799 Secondary malignant neoplasm of unspecified site: Secondary | ICD-10-CM

## 2019-03-28 DIAGNOSIS — C482 Malignant neoplasm of peritoneum, unspecified: Secondary | ICD-10-CM | POA: Diagnosis not present

## 2019-03-28 MED ORDER — PEGFILGRASTIM INJECTION 6 MG/0.6ML ~~LOC~~
6.0000 mg | PREFILLED_SYRINGE | Freq: Once | SUBCUTANEOUS | Status: AC
  Administered 2019-03-28: 6 mg via SUBCUTANEOUS

## 2019-03-28 MED ORDER — PEGFILGRASTIM INJECTION 6 MG/0.6ML ~~LOC~~
PREFILLED_SYRINGE | SUBCUTANEOUS | Status: AC
  Filled 2019-03-28: qty 0.6

## 2019-03-28 MED ORDER — SODIUM CHLORIDE 0.9% FLUSH
10.0000 mL | INTRAVENOUS | Status: DC | PRN
  Administered 2019-03-28: 10 mL
  Filled 2019-03-28: qty 10

## 2019-03-28 MED ORDER — HEPARIN SOD (PORK) LOCK FLUSH 100 UNIT/ML IV SOLN
500.0000 [IU] | Freq: Once | INTRAVENOUS | Status: AC | PRN
  Administered 2019-03-28: 500 [IU]
  Filled 2019-03-28: qty 5

## 2019-03-28 NOTE — Patient Instructions (Signed)
Pegfilgrastim injection  What is this medicine?  PEGFILGRASTIM (PEG fil gra stim) is a long-acting granulocyte colony-stimulating factor that stimulates the growth of neutrophils, a type of white blood cell important in the body's fight against infection. It is used to reduce the incidence of fever and infection in patients with certain types of cancer who are receiving chemotherapy that affects the bone marrow, and to increase survival after being exposed to high doses of radiation.  This medicine may be used for other purposes; ask your health care provider or pharmacist if you have questions.  COMMON BRAND NAME(S): Fulphila, Neulasta, UDENYCA  What should I tell my health care provider before I take this medicine?  They need to know if you have any of these conditions:  -kidney disease  -latex allergy  -ongoing radiation therapy  -sickle cell disease  -skin reactions to acrylic adhesives (On-Body Injector only)  -an unusual or allergic reaction to pegfilgrastim, filgrastim, other medicines, foods, dyes, or preservatives  -pregnant or trying to get pregnant  -breast-feeding  How should I use this medicine?  This medicine is for injection under the skin. If you get this medicine at home, you will be taught how to prepare and give the pre-filled syringe or how to use the On-body Injector. Refer to the patient Instructions for Use for detailed instructions. Use exactly as directed. Tell your healthcare provider immediately if you suspect that the On-body Injector may not have performed as intended or if you suspect the use of the On-body Injector resulted in a missed or partial dose.  It is important that you put your used needles and syringes in a special sharps container. Do not put them in a trash can. If you do not have a sharps container, call your pharmacist or healthcare provider to get one.  Talk to your pediatrician regarding the use of this medicine in children. While this drug may be prescribed for  selected conditions, precautions do apply.  Overdosage: If you think you have taken too much of this medicine contact a poison control center or emergency room at once.  NOTE: This medicine is only for you. Do not share this medicine with others.  What if I miss a dose?  It is important not to miss your dose. Call your doctor or health care professional if you miss your dose. If you miss a dose due to an On-body Injector failure or leakage, a new dose should be administered as soon as possible using a single prefilled syringe for manual use.  What may interact with this medicine?  Interactions have not been studied.  Give your health care provider a list of all the medicines, herbs, non-prescription drugs, or dietary supplements you use. Also tell them if you smoke, drink alcohol, or use illegal drugs. Some items may interact with your medicine.  This list may not describe all possible interactions. Give your health care provider a list of all the medicines, herbs, non-prescription drugs, or dietary supplements you use. Also tell them if you smoke, drink alcohol, or use illegal drugs. Some items may interact with your medicine.  What should I watch for while using this medicine?  You may need blood work done while you are taking this medicine.  If you are going to need a MRI, CT scan, or other procedure, tell your doctor that you are using this medicine (On-Body Injector only).  What side effects may I notice from receiving this medicine?  Side effects that you should report to   your doctor or health care professional as soon as possible:  -allergic reactions like skin rash, itching or hives, swelling of the face, lips, or tongue  -back pain  -dizziness  -fever  -pain, redness, or irritation at site where injected  -pinpoint red spots on the skin  -red or dark-brown urine  -shortness of breath or breathing problems  -stomach or side pain, or pain at the shoulder  -swelling  -tiredness  -trouble passing urine or  change in the amount of urine  Side effects that usually do not require medical attention (report to your doctor or health care professional if they continue or are bothersome):  -bone pain  -muscle pain  This list may not describe all possible side effects. Call your doctor for medical advice about side effects. You may report side effects to FDA at 1-800-FDA-1088.  Where should I keep my medicine?  Keep out of the reach of children.  If you are using this medicine at home, you will be instructed on how to store it. Throw away any unused medicine after the expiration date on the label.  NOTE: This sheet is a summary. It may not cover all possible information. If you have questions about this medicine, talk to your doctor, pharmacist, or health care provider.   2019 Elsevier/Gold Standard (2018-01-20 16:57:08)

## 2019-03-28 NOTE — Progress Notes (Signed)
Neulasta wasn't released in treatment plan before treatment day was completed.  Manually entered Neulasta 6 mg and gave.

## 2019-04-05 ENCOUNTER — Other Ambulatory Visit: Payer: Self-pay | Admitting: Oncology

## 2019-04-09 ENCOUNTER — Inpatient Hospital Stay (HOSPITAL_BASED_OUTPATIENT_CLINIC_OR_DEPARTMENT_OTHER): Payer: BC Managed Care – PPO | Admitting: Oncology

## 2019-04-09 ENCOUNTER — Other Ambulatory Visit: Payer: Self-pay

## 2019-04-09 ENCOUNTER — Inpatient Hospital Stay: Payer: BC Managed Care – PPO | Attending: Gynecologic Oncology

## 2019-04-09 ENCOUNTER — Telehealth: Payer: Self-pay | Admitting: Oncology

## 2019-04-09 ENCOUNTER — Inpatient Hospital Stay: Payer: BC Managed Care – PPO

## 2019-04-09 VITALS — BP 151/91 | HR 77 | Temp 98.7°F | Resp 17 | Ht 63.0 in | Wt 194.7 lb

## 2019-04-09 VITALS — BP 148/92

## 2019-04-09 DIAGNOSIS — C799 Secondary malignant neoplasm of unspecified site: Secondary | ICD-10-CM

## 2019-04-09 DIAGNOSIS — E039 Hypothyroidism, unspecified: Secondary | ICD-10-CM | POA: Diagnosis not present

## 2019-04-09 DIAGNOSIS — T451X5A Adverse effect of antineoplastic and immunosuppressive drugs, initial encounter: Secondary | ICD-10-CM | POA: Insufficient documentation

## 2019-04-09 DIAGNOSIS — D701 Agranulocytosis secondary to cancer chemotherapy: Secondary | ICD-10-CM

## 2019-04-09 DIAGNOSIS — C701 Malignant neoplasm of spinal meninges: Secondary | ICD-10-CM | POA: Diagnosis not present

## 2019-04-09 DIAGNOSIS — C801 Malignant (primary) neoplasm, unspecified: Secondary | ICD-10-CM

## 2019-04-09 DIAGNOSIS — D6959 Other secondary thrombocytopenia: Secondary | ICD-10-CM | POA: Insufficient documentation

## 2019-04-09 DIAGNOSIS — Z79899 Other long term (current) drug therapy: Secondary | ICD-10-CM | POA: Insufficient documentation

## 2019-04-09 DIAGNOSIS — C786 Secondary malignant neoplasm of retroperitoneum and peritoneum: Secondary | ICD-10-CM | POA: Diagnosis present

## 2019-04-09 DIAGNOSIS — Z95828 Presence of other vascular implants and grafts: Secondary | ICD-10-CM

## 2019-04-09 DIAGNOSIS — Z5111 Encounter for antineoplastic chemotherapy: Secondary | ICD-10-CM | POA: Insufficient documentation

## 2019-04-09 LAB — CMP (CANCER CENTER ONLY)
ALT: 49 U/L — ABNORMAL HIGH (ref 0–44)
AST: 44 U/L — ABNORMAL HIGH (ref 15–41)
Albumin: 3.6 g/dL (ref 3.5–5.0)
Alkaline Phosphatase: 173 U/L — ABNORMAL HIGH (ref 38–126)
Anion gap: 10 (ref 5–15)
BUN: 12 mg/dL (ref 6–20)
CO2: 24 mmol/L (ref 22–32)
Calcium: 8.8 mg/dL — ABNORMAL LOW (ref 8.9–10.3)
Chloride: 107 mmol/L (ref 98–111)
Creatinine: 0.82 mg/dL (ref 0.44–1.00)
GFR, Est AFR Am: >60 mL/min
GFR, Estimated: >60 mL/min
Glucose, Bld: 105 mg/dL — ABNORMAL HIGH (ref 70–99)
Potassium: 4.1 mmol/L (ref 3.5–5.1)
Sodium: 141 mmol/L (ref 135–145)
Total Bilirubin: 0.4 mg/dL (ref 0.3–1.2)
Total Protein: 6.6 g/dL (ref 6.5–8.1)

## 2019-04-09 LAB — CBC WITH DIFFERENTIAL (CANCER CENTER ONLY)
Abs Immature Granulocytes: 0.41 10*3/uL — ABNORMAL HIGH (ref 0.00–0.07)
Basophils Absolute: 0.1 10*3/uL (ref 0.0–0.1)
Basophils Relative: 1 %
Eosinophils Absolute: 0.3 10*3/uL (ref 0.0–0.5)
Eosinophils Relative: 3 %
HCT: 40.3 % (ref 36.0–46.0)
Hemoglobin: 13.2 g/dL (ref 12.0–15.0)
Immature Granulocytes: 4 %
Lymphocytes Relative: 18 %
Lymphs Abs: 1.7 10*3/uL (ref 0.7–4.0)
MCH: 29.1 pg (ref 26.0–34.0)
MCHC: 32.8 g/dL (ref 30.0–36.0)
MCV: 89 fL (ref 80.0–100.0)
Monocytes Absolute: 0.8 10*3/uL (ref 0.1–1.0)
Monocytes Relative: 8 %
Neutro Abs: 6.3 10*3/uL (ref 1.7–7.7)
Neutrophils Relative %: 66 %
Platelet Count: 115 10*3/uL — ABNORMAL LOW (ref 150–400)
RBC: 4.53 MIL/uL (ref 3.87–5.11)
RDW: 18.8 % — ABNORMAL HIGH (ref 11.5–15.5)
WBC Count: 9.5 10*3/uL (ref 4.0–10.5)
nRBC: 0 % (ref 0.0–0.2)

## 2019-04-09 LAB — CEA (IN HOUSE-CHCC): CEA (CHCC-In House): 2.91 ng/mL (ref 0.00–5.00)

## 2019-04-09 MED ORDER — HEPARIN SOD (PORK) LOCK FLUSH 100 UNIT/ML IV SOLN
500.0000 [IU] | Freq: Once | INTRAVENOUS | Status: DC | PRN
  Filled 2019-04-09: qty 5

## 2019-04-09 MED ORDER — FLUOROURACIL CHEMO INJECTION 2.5 GM/50ML
400.0000 mg/m2 | Freq: Once | INTRAVENOUS | Status: AC
  Administered 2019-04-09: 15:00:00 800 mg via INTRAVENOUS
  Filled 2019-04-09: qty 16

## 2019-04-09 MED ORDER — SODIUM CHLORIDE 0.9 % IV SOLN
Freq: Once | INTRAVENOUS | Status: AC
  Administered 2019-04-09: 12:00:00 via INTRAVENOUS
  Filled 2019-04-09: qty 5

## 2019-04-09 MED ORDER — PALONOSETRON HCL INJECTION 0.25 MG/5ML
INTRAVENOUS | Status: AC
  Filled 2019-04-09: qty 5

## 2019-04-09 MED ORDER — DEXTROSE 5 % IV SOLN
Freq: Once | INTRAVENOUS | Status: AC
  Administered 2019-04-09: 11:00:00 via INTRAVENOUS
  Filled 2019-04-09: qty 250

## 2019-04-09 MED ORDER — SODIUM CHLORIDE 0.9 % IV SOLN
2400.0000 mg/m2 | INTRAVENOUS | Status: DC
  Administered 2019-04-09: 4750 mg via INTRAVENOUS
  Filled 2019-04-09: qty 95

## 2019-04-09 MED ORDER — SODIUM CHLORIDE 0.9% FLUSH
10.0000 mL | Freq: Once | INTRAVENOUS | Status: AC
  Administered 2019-04-09: 10 mL
  Filled 2019-04-09: qty 10

## 2019-04-09 MED ORDER — SODIUM CHLORIDE 0.9% FLUSH
10.0000 mL | INTRAVENOUS | Status: DC | PRN
  Filled 2019-04-09: qty 10

## 2019-04-09 MED ORDER — PALONOSETRON HCL INJECTION 0.25 MG/5ML
0.2500 mg | Freq: Once | INTRAVENOUS | Status: AC
  Administered 2019-04-09: 12:00:00 0.25 mg via INTRAVENOUS

## 2019-04-09 MED ORDER — LEUCOVORIN CALCIUM INJECTION 350 MG
400.0000 mg/m2 | Freq: Once | INTRAVENOUS | Status: AC
  Administered 2019-04-09: 788 mg via INTRAVENOUS
  Filled 2019-04-09: qty 39.4

## 2019-04-09 MED ORDER — OXALIPLATIN CHEMO INJECTION 100 MG/20ML
65.0000 mg/m2 | Freq: Once | INTRAVENOUS | Status: AC
  Administered 2019-04-09: 13:00:00 130 mg via INTRAVENOUS
  Filled 2019-04-09: qty 20

## 2019-04-09 NOTE — Telephone Encounter (Signed)
Scheduled appt per 6/11 los. °

## 2019-04-09 NOTE — Progress Notes (Signed)
Per Dr Benay Spice OK to proceed with FOLFOX with DBP > 90

## 2019-04-09 NOTE — Progress Notes (Signed)
[facility] OFFICE PROGRESS NOTE   Diagnosis: Metastatic adenocarcinoma  INTERVAL HISTORY:   Veronica Stuart completed another cycle of FOLFOX beginning 03/26/2019.  She had mild nausea beginning on day 4.  Nausea was relieved with Zofran.  She has cold sensitivity following chemotherapy.  No other neuropathy symptoms.  No abdominal pain.  She has multiple "bug bites "over the extremities. She saw Dr. Clovis Riley and will be considered for cytoreductive surgery after restaging CTs.  Objective:  Vital signs in last 24 hours:  Blood pressure (!) 151/91, pulse 77, temperature 98.7 F (37.1 C), temperature source Oral, resp. rate 17, height 5\' 3"  (1.6 m), weight 194 lb 11.2 oz (88.3 kg), SpO2 99 %.    HEENT: No thrush or ulcers GI: No hepatomegaly, no mass, no apparent ascites Vascular: No leg edema  Skin: 1 cm erythematous lesions over the right thigh, left lower leg, and left foot with central clearing consistent with insect bites  Portacath/PICC-without erythema  Lab Results:  Lab Results  Component Value Date   WBC 9.5 04/09/2019   HGB 13.2 04/09/2019   HCT 40.3 04/09/2019   MCV 89.0 04/09/2019   PLT 115 (L) 04/09/2019   NEUTROABS 6.3 04/09/2019    CMP  Lab Results  Component Value Date   NA 141 04/09/2019   K 4.1 04/09/2019   CL 107 04/09/2019   CO2 24 04/09/2019   GLUCOSE 105 (H) 04/09/2019   BUN 12 04/09/2019   CREATININE 0.82 04/09/2019   CALCIUM 8.8 (L) 04/09/2019   PROT 6.6 04/09/2019   ALBUMIN 3.6 04/09/2019   AST 44 (H) 04/09/2019   ALT 49 (H) 04/09/2019   ALKPHOS 173 (H) 04/09/2019   BILITOT 0.4 04/09/2019   GFRNONAA >60 04/09/2019   GFRAA >60 04/09/2019    Lab Results  Component Value Date   CEA1 8.03 (H) 01/15/2019     Medications: I have reviewed the patient's current medications.   Assessment/Plan: 1. 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 Numerous soft tissue implants throughout the visualized lower mesentery with a dominant 4.4 cm right lower quadrant peritoneal implant. Small volume pelvic ascites. 3.8 cm right ovarian mass.  01/15/2019-CEA 8  01/21/2019- CT biopsy of omental thickening left lower abdomen; pathology showed metastatic adenocarcinoma, positive for CDX-2, cytokeratin 20 and cytokeratin 7. Tumor negative for GATA-3, NapsinA, PAX 8, TTF-1, WT-1 and ER.  CTs 01/29/2019- extensive omental caking, dominant mass at the cecum, mild ascites, mild left hydronephrosis and hydroureter, indeterminate pulmonary nodules, borderline prominent left subpectoral node and upper normal size right supraclavicular node, borderline prominent aortocaval node  Cycle 1 FOLFOX 02/05/2019  Cycle 2 FOLFOX 02/19/2019 (oxaliplatin dose reduced due to mild neutropenia)  Cycle 3 FOLFOX 03/12/2019, Neulasta added  Cycle 4 FOLFOX 03/26/2019  Cycle 5 FOLFOX 04/09/2019 2. Intermittent nausea/vomiting, abdominal pain-question obstructive symptoms 3. Hypothyroidism 4. Port-A-Cath placement 02/02/2019 5. Mild left hydronephrosis noted on CT 01/29/2019 6. Oral candidiasis.  She will complete a 4-day course of Diflucan. 7. Neutropenia/thrombocytopenia secondary to chemotherapy- G-CSF support added with cycle 3 FOLFOX, oxaliplatin dose reduced beginning with cycle 2     Disposition: Veronica Stuart appears stable.  She has completed 4 cycles of FOLFOX.  We will follow-up on the CEA from  today.  She will complete 2 more cycles of FOLFOX prior to a restaging CT evaluation.  She will return for an office visit and chemotherapy in 2 weeks.  The lesions over the legs are consistent with insect bites.  She will contact us  if she has a fever or progressive skin changes.   Betsy Coder, MD  04/09/2019  10:48 AM

## 2019-04-09 NOTE — Patient Instructions (Signed)
[facility] Discharge Instructions for Patients Receiving Chemotherapy  Today you received the following chemotherapy agents :  Oxaliplatin,  Leucovorin,  Fluorouracil.  To help prevent nausea and vomiting after your treatment, we encourage you to take your nausea medication as prescribed.   If you develop nausea and vomiting that is not controlled by your nausea medication, call the clinic.   BELOW ARE SYMPTOMS THAT SHOULD BE REPORTED IMMEDIATELY:  *FEVER GREATER THAN 100.5 F  *CHILLS WITH OR WITHOUT FEVER  NAUSEA AND VOMITING THAT IS NOT CONTROLLED WITH YOUR NAUSEA MEDICATION  *UNUSUAL SHORTNESS OF BREATH  *UNUSUAL BRUISING OR BLEEDING  TENDERNESS IN MOUTH AND THROAT WITH OR WITHOUT PRESENCE OF ULCERS  *URINARY PROBLEMS  *BOWEL PROBLEMS  UNUSUAL RASH Items with * indicate a potential emergency and should be followed up as soon as possible.  Feel free to call the clinic should you have any questions or concerns. The clinic phone number is [phone].  Please show the CHEMO ALERT CARD at check-in to the Emergency Department and triage nurse.   

## 2019-04-09 NOTE — Progress Notes (Signed)
MD review of labs: OK to treat w/elevated AST and ALT. Notified patient of CEA result-now in normal range

## 2019-04-11 ENCOUNTER — Other Ambulatory Visit: Payer: Self-pay

## 2019-04-11 ENCOUNTER — Inpatient Hospital Stay: Payer: BC Managed Care – PPO

## 2019-04-11 VITALS — BP 125/80 | HR 70 | Temp 98.5°F | Resp 18

## 2019-04-11 DIAGNOSIS — C786 Secondary malignant neoplasm of retroperitoneum and peritoneum: Secondary | ICD-10-CM | POA: Diagnosis not present

## 2019-04-11 DIAGNOSIS — C799 Secondary malignant neoplasm of unspecified site: Secondary | ICD-10-CM

## 2019-04-11 MED ORDER — SODIUM CHLORIDE 0.9% FLUSH
10.0000 mL | INTRAVENOUS | Status: DC | PRN
  Administered 2019-04-11: 10 mL
  Filled 2019-04-11: qty 10

## 2019-04-11 MED ORDER — HEPARIN SOD (PORK) LOCK FLUSH 100 UNIT/ML IV SOLN
500.0000 [IU] | Freq: Once | INTRAVENOUS | Status: AC | PRN
  Administered 2019-04-11: 12:00:00 500 [IU]
  Filled 2019-04-11: qty 5

## 2019-04-11 MED ORDER — PEGFILGRASTIM INJECTION 6 MG/0.6ML ~~LOC~~
6.0000 mg | PREFILLED_SYRINGE | Freq: Once | SUBCUTANEOUS | Status: AC
  Administered 2019-04-11: 6 mg via SUBCUTANEOUS

## 2019-04-11 MED ORDER — PEGFILGRASTIM INJECTION 6 MG/0.6ML ~~LOC~~
PREFILLED_SYRINGE | SUBCUTANEOUS | Status: AC
  Filled 2019-04-11: qty 0.6

## 2019-04-18 ENCOUNTER — Other Ambulatory Visit: Payer: Self-pay | Admitting: Oncology

## 2019-04-19 ENCOUNTER — Other Ambulatory Visit: Payer: Self-pay | Admitting: Family Medicine

## 2019-04-19 DIAGNOSIS — E039 Hypothyroidism, unspecified: Secondary | ICD-10-CM

## 2019-04-20 NOTE — Telephone Encounter (Signed)
Lm- ntbs

## 2019-04-20 NOTE — Telephone Encounter (Signed)
[place]. NTBS 30 days given 03/24/19

## 2019-04-23 ENCOUNTER — Inpatient Hospital Stay: Payer: BC Managed Care – PPO

## 2019-04-23 ENCOUNTER — Inpatient Hospital Stay (HOSPITAL_BASED_OUTPATIENT_CLINIC_OR_DEPARTMENT_OTHER): Payer: BC Managed Care – PPO | Admitting: Nurse Practitioner

## 2019-04-23 ENCOUNTER — Encounter: Payer: Self-pay | Admitting: Nurse Practitioner

## 2019-04-23 ENCOUNTER — Other Ambulatory Visit: Payer: Self-pay

## 2019-04-23 VITALS — BP 130/85 | HR 86 | Temp 98.3°F | Resp 18 | Ht 63.0 in | Wt 196.2 lb

## 2019-04-23 DIAGNOSIS — C786 Secondary malignant neoplasm of retroperitoneum and peritoneum: Secondary | ICD-10-CM

## 2019-04-23 DIAGNOSIS — C801 Malignant (primary) neoplasm, unspecified: Secondary | ICD-10-CM

## 2019-04-23 DIAGNOSIS — D701 Agranulocytosis secondary to cancer chemotherapy: Secondary | ICD-10-CM | POA: Diagnosis not present

## 2019-04-23 DIAGNOSIS — C799 Secondary malignant neoplasm of unspecified site: Secondary | ICD-10-CM

## 2019-04-23 DIAGNOSIS — Z79899 Other long term (current) drug therapy: Secondary | ICD-10-CM

## 2019-04-23 DIAGNOSIS — E039 Hypothyroidism, unspecified: Secondary | ICD-10-CM

## 2019-04-23 DIAGNOSIS — Z95828 Presence of other vascular implants and grafts: Secondary | ICD-10-CM

## 2019-04-23 DIAGNOSIS — D6959 Other secondary thrombocytopenia: Secondary | ICD-10-CM | POA: Diagnosis not present

## 2019-04-23 LAB — CBC WITH DIFFERENTIAL (CANCER CENTER ONLY)
Abs Immature Granulocytes: 0.28 10*3/uL — ABNORMAL HIGH (ref 0.00–0.07)
Basophils Absolute: 0.1 10*3/uL (ref 0.0–0.1)
Basophils Relative: 1 %
Eosinophils Absolute: 0.2 10*3/uL (ref 0.0–0.5)
Eosinophils Relative: 2 %
HCT: 42.4 % (ref 36.0–46.0)
Hemoglobin: 14.1 g/dL (ref 12.0–15.0)
Immature Granulocytes: 3 %
Lymphocytes Relative: 19 %
Lymphs Abs: 2.1 10*3/uL (ref 0.7–4.0)
MCH: 29.7 pg (ref 26.0–34.0)
MCHC: 33.3 g/dL (ref 30.0–36.0)
MCV: 89.3 fL (ref 80.0–100.0)
Monocytes Absolute: 0.9 10*3/uL (ref 0.1–1.0)
Monocytes Relative: 8 %
Neutro Abs: 7.6 10*3/uL (ref 1.7–7.7)
Neutrophils Relative %: 67 %
Platelet Count: 108 10*3/uL — ABNORMAL LOW (ref 150–400)
RBC: 4.75 MIL/uL (ref 3.87–5.11)
RDW: 19.3 % — ABNORMAL HIGH (ref 11.5–15.5)
WBC Count: 11.2 10*3/uL — ABNORMAL HIGH (ref 4.0–10.5)
nRBC: 0 % (ref 0.0–0.2)

## 2019-04-23 LAB — CMP (CANCER CENTER ONLY)
ALT: 43 U/L (ref 0–44)
AST: 47 U/L — ABNORMAL HIGH (ref 15–41)
Albumin: 4 g/dL (ref 3.5–5.0)
Alkaline Phosphatase: 203 U/L — ABNORMAL HIGH (ref 38–126)
Anion gap: 11 (ref 5–15)
BUN: 12 mg/dL (ref 6–20)
CO2: 23 mmol/L (ref 22–32)
Calcium: 9 mg/dL (ref 8.9–10.3)
Chloride: 106 mmol/L (ref 98–111)
Creatinine: 0.84 mg/dL (ref 0.44–1.00)
GFR, Est AFR Am: >60 mL/min (ref >60)
GFR, Estimated: >60 mL/min (ref >60)
Glucose, Bld: 93 mg/dL (ref 70–99)
Potassium: 4 mmol/L (ref 3.5–5.1)
Sodium: 140 mmol/L (ref 135–145)
Total Bilirubin: 0.5 mg/dL (ref 0.3–1.2)
Total Protein: 7.3 g/dL (ref 6.5–8.1)

## 2019-04-23 MED ORDER — SODIUM CHLORIDE 0.9% FLUSH
10.0000 mL | Freq: Once | INTRAVENOUS | Status: AC
  Administered 2019-04-23: 10 mL
  Filled 2019-04-23: qty 10

## 2019-04-23 MED ORDER — PALONOSETRON HCL INJECTION 0.25 MG/5ML
INTRAVENOUS | Status: AC
  Filled 2019-04-23: qty 5

## 2019-04-23 MED ORDER — LEUCOVORIN CALCIUM INJECTION 350 MG
400.0000 mg/m2 | Freq: Once | INTRAVENOUS | Status: AC
  Administered 2019-04-23: 788 mg via INTRAVENOUS
  Filled 2019-04-23: qty 39.4

## 2019-04-23 MED ORDER — PALONOSETRON HCL INJECTION 0.25 MG/5ML
0.2500 mg | Freq: Once | INTRAVENOUS | Status: AC
  Administered 2019-04-23: 0.25 mg via INTRAVENOUS

## 2019-04-23 MED ORDER — OXALIPLATIN CHEMO INJECTION 100 MG/20ML
65.0000 mg/m2 | Freq: Once | INTRAVENOUS | Status: AC
  Administered 2019-04-23: 130 mg via INTRAVENOUS
  Filled 2019-04-23: qty 20

## 2019-04-23 MED ORDER — SODIUM CHLORIDE 0.9 % IV SOLN
2400.0000 mg/m2 | INTRAVENOUS | Status: DC
  Administered 2019-04-23: 4750 mg via INTRAVENOUS
  Filled 2019-04-23: qty 95

## 2019-04-23 MED ORDER — SODIUM CHLORIDE 0.9 % IV SOLN
Freq: Once | INTRAVENOUS | Status: AC
  Administered 2019-04-23: 13:00:00 via INTRAVENOUS
  Filled 2019-04-23: qty 5

## 2019-04-23 MED ORDER — DEXTROSE 5 % IV SOLN
Freq: Once | INTRAVENOUS | Status: AC
  Administered 2019-04-23: 13:00:00 via INTRAVENOUS
  Filled 2019-04-23: qty 250

## 2019-04-23 MED ORDER — FLUOROURACIL CHEMO INJECTION 2.5 GM/50ML
400.0000 mg/m2 | Freq: Once | INTRAVENOUS | Status: AC
  Administered 2019-04-23: 800 mg via INTRAVENOUS
  Filled 2019-04-23: qty 16

## 2019-04-23 NOTE — Progress Notes (Signed)
  [place] OFFICE PROGRESS NOTE   Diagnosis: Metastatic adenocarcinoma  INTERVAL HISTORY:   [name] returns as scheduled.  She completed cycle 5 FOLFOX 04/09/2019.  She denies nausea/vomiting.  No mouth sores.  No diarrhea.  She notes intermittent numbness/tingling in the hands and feet related to cold sensitivity.  No abdominal pain.  She has a good appetite.  Objective:  Vital signs in last 24 hours:  Blood pressure 130/85, pulse 86, temperature 98.3 F (36.8 C), temperature source Oral, resp. rate 18, height 5\' 3"  (1.6 m), weight 196 lb 3.2 oz (89 kg), SpO2 100 %.    HEENT: No thrush or ulcers. GI: Abdomen soft and nontender.  No hepatomegaly.  No mass. Vascular: No leg edema. Neuro: Vibratory sense intact over the fingertips per tuning fork exam. Skin: Palms without erythema. Port-A-Cath without erythema.   Lab Results:  Lab Results  Component Value Date   WBC 11.2 (H) 04/23/2019   HGB 14.1 04/23/2019   HCT 42.4 04/23/2019   MCV 89.3 04/23/2019   PLT 108 (L) 04/23/2019   NEUTROABS 7.6 04/23/2019    Imaging:  No results found.  Medications: I have reviewed the patient's current medications.  Assessment/Plan: 1. 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 Numerous soft tissue implants throughout the visualized lower mesentery with a dominant 4.4 cm right lower quadrant peritoneal implant. Small volume pelvic ascites. 3.8 cm right ovarian mass.  01/15/2019-CEA 8  01/21/2019-CT biopsy of omental thickening left lower abdomen; pathology showed metastatic adenocarcinoma, positive for CDX-2, cytokeratin 20 and cytokeratin  7. Tumor negative for GATA-3, NapsinA, PAX 8, TTF-1, WT-1 and ER.  CTs 01/29/2019-extensive omental caking, dominant mass at the cecum, mild ascites, mild left hydronephrosis and hydroureter, indeterminate pulmonary nodules, borderline prominent left subpectoral node and upper normal size right supraclavicular node, borderline prominent aortocaval node  Cycle 1 FOLFOX 02/05/2019  Cycle 2 FOLFOX 02/19/2019 (oxaliplatin dose reduced due to mild neutropenia)  Cycle 3 FOLFOX 03/12/2019, Neulasta added  Cycle 4 FOLFOX 03/26/2019  Cycle 5 FOLFOX 04/09/2019  Cycle 6 FOLFOX 04/23/2019 2. Intermittent nausea/vomiting, abdominal pain-question obstructive symptoms 3. Hypothyroidism 4. Port-A-Cath placement 02/02/2019 5. Mild left hydronephrosis noted on CT 01/29/2019 6. Oral candidiasis. She will complete a 4-day course of Diflucan. 7. Neutropenia/thrombocytopenia secondary to chemotherapy- G-CSF support added with cycle 3 FOLFOX, oxaliplatin dose reduced beginning with cycle 2    Disposition: Veronica Stuart appears stable.  She has completed 5 cycles of FOLFOX.  Plan to proceed with cycle 6 today as scheduled.  She will undergo restaging CTs at [facility]/follow-up with Dr. Clovis Riley mid July.  She will return for a follow-up visit here 05/15/2019.  We reviewed the CBC from today.  Counts are adequate for treatment.  She will not receive Neulasta with this cycle.  Plan reviewed with Dr. Benay Spice.   Ned Card ANP/GNP-BC   04/23/2019  12:00 PM

## 2019-04-23 NOTE — Patient Instructions (Signed)

## 2019-04-24 ENCOUNTER — Telehealth: Payer: Self-pay | Admitting: Oncology

## 2019-04-24 NOTE — Telephone Encounter (Signed)
Called and left msg for patient  °

## 2019-04-25 ENCOUNTER — Inpatient Hospital Stay: Payer: BC Managed Care – PPO

## 2019-04-25 ENCOUNTER — Other Ambulatory Visit: Payer: Self-pay

## 2019-04-25 VITALS — BP 129/80 | HR 69 | Temp 98.3°F | Resp 16

## 2019-04-25 DIAGNOSIS — C799 Secondary malignant neoplasm of unspecified site: Secondary | ICD-10-CM

## 2019-04-25 DIAGNOSIS — C786 Secondary malignant neoplasm of retroperitoneum and peritoneum: Secondary | ICD-10-CM | POA: Diagnosis not present

## 2019-04-25 MED ORDER — PEGFILGRASTIM INJECTION 6 MG/0.6ML ~~LOC~~
PREFILLED_SYRINGE | SUBCUTANEOUS | Status: AC
  Filled 2019-04-25: qty 0.6

## 2019-04-25 MED ORDER — HEPARIN SOD (PORK) LOCK FLUSH 100 UNIT/ML IV SOLN
500.0000 [IU] | Freq: Once | INTRAVENOUS | Status: AC | PRN
  Administered 2019-04-25: 500 [IU]
  Filled 2019-04-25: qty 5

## 2019-04-25 MED ORDER — SODIUM CHLORIDE 0.9% FLUSH
10.0000 mL | INTRAVENOUS | Status: DC | PRN
  Administered 2019-04-25: 10 mL
  Filled 2019-04-25: qty 10

## 2019-04-30 ENCOUNTER — Telehealth: Payer: Self-pay | Admitting: *Deleted

## 2019-04-30 ENCOUNTER — Other Ambulatory Visit: Payer: Self-pay

## 2019-04-30 ENCOUNTER — Encounter: Payer: Self-pay | Admitting: Nurse Practitioner

## 2019-04-30 ENCOUNTER — Inpatient Hospital Stay: Payer: BC Managed Care – PPO | Attending: Gynecologic Oncology

## 2019-04-30 DIAGNOSIS — D701 Agranulocytosis secondary to cancer chemotherapy: Secondary | ICD-10-CM | POA: Insufficient documentation

## 2019-04-30 DIAGNOSIS — D6959 Other secondary thrombocytopenia: Secondary | ICD-10-CM | POA: Diagnosis not present

## 2019-04-30 DIAGNOSIS — Z79899 Other long term (current) drug therapy: Secondary | ICD-10-CM | POA: Insufficient documentation

## 2019-04-30 DIAGNOSIS — R300 Dysuria: Secondary | ICD-10-CM

## 2019-04-30 DIAGNOSIS — C799 Secondary malignant neoplasm of unspecified site: Secondary | ICD-10-CM

## 2019-04-30 DIAGNOSIS — C786 Secondary malignant neoplasm of retroperitoneum and peritoneum: Secondary | ICD-10-CM | POA: Diagnosis present

## 2019-04-30 DIAGNOSIS — B370 Candidal stomatitis: Secondary | ICD-10-CM | POA: Diagnosis not present

## 2019-04-30 DIAGNOSIS — C801 Malignant (primary) neoplasm, unspecified: Secondary | ICD-10-CM | POA: Insufficient documentation

## 2019-04-30 DIAGNOSIS — Z5111 Encounter for antineoplastic chemotherapy: Secondary | ICD-10-CM | POA: Diagnosis not present

## 2019-04-30 DIAGNOSIS — T451X5A Adverse effect of antineoplastic and immunosuppressive drugs, initial encounter: Secondary | ICD-10-CM | POA: Insufficient documentation

## 2019-04-30 DIAGNOSIS — E039 Hypothyroidism, unspecified: Secondary | ICD-10-CM | POA: Diagnosis not present

## 2019-04-30 LAB — URINALYSIS, COMPLETE (UACMP) WITH MICROSCOPIC
Bilirubin Urine: NEGATIVE
Glucose, UA: NEGATIVE mg/dL
Hgb urine dipstick: NEGATIVE
Ketones, ur: NEGATIVE mg/dL
Nitrite: NEGATIVE
Protein, ur: NEGATIVE mg/dL
Specific Gravity, Urine: 1.003 — ABNORMAL LOW (ref 1.005–1.030)
pH: 6 (ref 5.0–8.0)

## 2019-04-30 NOTE — Telephone Encounter (Signed)
Patient left VM. Burning w/urination. Thinks she may have UTI.  Per Dr. Benay Spice: Needs U/A and culture today. Left VM for her to come at 2pm for lab appointment

## 2019-04-30 NOTE — Telephone Encounter (Signed)
Called back and left message that her U/A was negative for UTI. Try pushing fluids. Can try OTC Azo tabs prn.

## 2019-05-01 LAB — URINE CULTURE: Culture: NO GROWTH

## 2019-05-04 ENCOUNTER — Encounter: Payer: Self-pay | Admitting: Nurse Practitioner

## 2019-05-05 ENCOUNTER — Telehealth: Payer: Self-pay

## 2019-05-05 NOTE — Telephone Encounter (Signed)
Received non-urgent medical question from patient in regard to needing her missing paperwork from June 13 for insurance purposes. Called patient and let her know that I printed off her AVS for her from June 13 visit. Patient stated that she would stop by to pick up paperwork on Thursday 05/07/19 and I Let her know that it will be at [facility] desk with a note on it stating that she'd be coming to pick it up

## 2019-05-07 ENCOUNTER — Telehealth: Payer: Self-pay

## 2019-05-07 NOTE — Progress Notes (Signed)
Patient came and picked up AVS that she requested from June 13 visit.

## 2019-05-07 NOTE — Telephone Encounter (Signed)
PROGRESS NOTE: Patient came and picked up AVS that she requested from June 13 visit.

## 2019-05-11 ENCOUNTER — Ambulatory Visit (INDEPENDENT_AMBULATORY_CARE_PROVIDER_SITE_OTHER): Payer: BC Managed Care – PPO | Admitting: Family Medicine

## 2019-05-11 ENCOUNTER — Telehealth: Payer: Self-pay | Admitting: *Deleted

## 2019-05-11 ENCOUNTER — Encounter: Payer: Self-pay | Admitting: Nurse Practitioner

## 2019-05-11 ENCOUNTER — Encounter: Payer: Self-pay | Admitting: Family Medicine

## 2019-05-11 ENCOUNTER — Other Ambulatory Visit: Payer: Self-pay

## 2019-05-11 VITALS — BP 136/89 | HR 107 | Temp 98.8°F | Ht 63.0 in | Wt 199.0 lb

## 2019-05-11 DIAGNOSIS — Z91038 Other insect allergy status: Secondary | ICD-10-CM

## 2019-05-11 DIAGNOSIS — W57XXXA Bitten or stung by nonvenomous insect and other nonvenomous arthropods, initial encounter: Secondary | ICD-10-CM

## 2019-05-11 DIAGNOSIS — S70361A Insect bite (nonvenomous), right thigh, initial encounter: Secondary | ICD-10-CM

## 2019-05-11 MED ORDER — DOXYCYCLINE HYCLATE 100 MG PO TABS: 100.0000 mg | ORAL_TABLET | Freq: Two times a day (BID) | ORAL | 0 refills | Status: DC

## 2019-05-11 NOTE — Telephone Encounter (Signed)
Discussed with patient the insect bites she has on foot/legs/abdomen and MD reviewed the photos she sent through [facility]. Dr. Benay Spice does not feel it is related to her cancer or chemotherapy. Instructed patient to contact her PCP to have this evaluated. She understands and agrees

## 2019-05-11 NOTE — Progress Notes (Signed)
Subjective: CC: Insect bites PCP: Sharion Balloon, FNP VOH:YWVPX T Righter is a 58 y.o. female presenting to clinic today for:  1.  Insect bites Patient reports onset of insect bites on Friday.  She notes that this initially was just 2 spots behind her leg which she developed several since that time along her posterior right thigh, foot and abdomen.  She notes this occurred last year as well.  She is been applying "everything under the sun" including Caladryl, aloe vera, which hazel, cortisone and taking Benadryl.  She is also applied topical Neosporin.  There are couple of lesions that appear more red and inflamed and therefore she came in to get checked out.  Of note she is undergoing chemotherapy for stage IV colon cancer.  Both her white blood cell and platelet levels are slightly down last visit.  She did send pictures to her oncologist who did not think that the lesions were related to her therapy.   ROS: Per HPI  Allergies  Allergen Reactions  . Other Other (See Comments)    TEGADERM DRESSING--rash and blistered skin  . Celecoxib Other (See Comments)    REACTION: INCOHERENT "felt like was in a fog"  . Lisinopril Other (See Comments)    REACTION: unknown "joint pain"   Past Medical History:  Diagnosis Date  . Chest pain, unspecified    stress ech 04/20/10 - normal. EF normal (echo done 2/09- EF 65%)   . Dizziness   . Dyspnea   . HTN (hypertension)   . Hypothyroidism   . Palpitations   . Tendonitis of shoulder, right     Current Outpatient Medications:  .  acetaminophen (TYLENOL) 500 MG tablet, Take 500 mg by mouth every 6 (six) hours as needed.  , Disp: , Rfl:  .  cetirizine (ZYRTEC) 10 MG tablet, Take 10 mg by mouth daily.  , Disp: , Rfl:  .  dexamethasone (DECADRON) 4 MG tablet, Take 4 mg by mouth 2 (two) times a day. X 3 days after tx, Disp: , Rfl:  .  docusate sodium (COLACE) 100 MG capsule, Take 100 mg by mouth 2 (two) times daily., Disp: , Rfl:  .  levothyroxine  (SYNTHROID) 88 MCG tablet, Take one tablet daily., Disp: 30 tablet, Rfl: 0 .  lidocaine-prilocaine (EMLA) cream, Apply to Port-A-Cath site 1 to 2 hours prior to use, Disp: 30 g, Rfl: 2 .  LORazepam (ATIVAN) 0.5 MG tablet, Place 1 tablet (0.5 mg total) under the tongue every 6 (six) hours as needed (nausea)., Disp: 60 tablet, Rfl: 0 .  ondansetron (ZOFRAN) 8 MG tablet, Take 1 tablet (8 mg total) by mouth every 8 (eight) hours as needed for nausea or vomiting., Disp: 30 tablet, Rfl: 1 .  polyethylene glycol powder (GLYCOLAX/MIRALAX) powder, Take 1 Container by mouth daily., Disp: , Rfl:  .  prochlorperazine (COMPAZINE) 10 MG tablet, Take 1 tablet (10 mg total) by mouth every 6 (six) hours as needed for nausea or vomiting. (Patient not taking: Reported on 04/09/2019), Disp: 30 tablet, Rfl: 2 .  traMADol (ULTRAM) 50 MG tablet, Take 1 tablet (50 mg total) by mouth every 6 (six) hours as needed. (Patient not taking: Reported on 04/09/2019), Disp: 60 tablet, Rfl: 0 Social History   Socioeconomic History  . Marital status: Married    Spouse name: Not on file  . Number of children: Not on file  . Years of education: Not on file  . Highest education level: Not on file  Occupational  History  . Not on file  Social Needs  . Financial resource strain: Not on file  . Food insecurity    Worry: Not on file    Inability: Not on file  . Transportation needs    Medical: Not on file    Non-medical: Not on file  Tobacco Use  . Smoking status: Never Smoker  . Smokeless tobacco: Never Used  . Tobacco comment: quit in 1978   Substance and Sexual Activity  . Alcohol use: No  . Drug use: No  . Sexual activity: Not on file  Lifestyle  . Physical activity    Days per week: Not on file    Minutes per session: Not on file  . Stress: Not on file  Relationships  . Social Herbalist on phone: Not on file    Gets together: Not on file    Attends religious service: Not on file    Active member of club  or organization: Not on file    Attends meetings of clubs or organizations: Not on file    Relationship status: Not on file  . Intimate partner violence    Fear of current or ex partner: Not on file    Emotionally abused: Not on file    Physically abused: Not on file    Forced sexual activity: Not on file  Other Topics Concern  . Not on file  Social History Narrative   Full time - teacher.    Married.   Does not get regular exercise.    Family History  Problem Relation Age of Onset  . Diabetes Mother   . Hyperlipidemia Mother   . Hypertension Mother   . Cancer Other        several kinds on mothers side  . Diabetes Other        family hx  . Thyroid disease Other        family hx     Objective: Office vital signs reviewed. BP 136/89   Pulse (!) 107   Temp 98.8 F (37.1 C) (Oral)   Ht 5\' 3"  (1.6 m)   Wt 199 lb (90.3 kg)   BMI 35.25 kg/m   Physical Examination:  General: Awake, alert, well nourished, No acute distress Skin: Patient has several, erythematous, indurated lesions noted along the posterior legs.  She has a large ecchymosis noted along the dorsal right foot.  There is no appreciable fluctuance of the lesions or drainage.  She does have increased warmth of the lesions.  Assessment/ Plan: 58 y.o. female   1. Bug bite with infection, initial encounter Induration and erythema most certainly may be related to allergic reaction from insect bites but given immunocompromise state will empirically treat with oral antibiotics.  We discussed home care instructions including reasons for reevaluation.  She voiced good understanding will follow-up PRN. - doxycycline (VIBRA-TABS) 100 MG tablet; Take 1 tablet (100 mg total) by mouth 2 (two) times daily.  Dispense: 20 tablet; Refill: 0  2. Allergic reaction to insect bite Okay to continue calamine lotion, cortisone cream and Benadryl if needed.  Avoid use of Neosporin given increased risk of local site reactions.   No  orders of the defined types were placed in this encounter.  No orders of the defined types were placed in this encounter.    Janora Norlander, DO [place] [phone]

## 2019-05-15 ENCOUNTER — Other Ambulatory Visit: Payer: Self-pay

## 2019-05-15 ENCOUNTER — Telehealth: Payer: Self-pay | Admitting: Oncology

## 2019-05-15 ENCOUNTER — Inpatient Hospital Stay
Admission: RE | Admit: 2019-05-15 | Discharge: 2019-05-15 | Disposition: A | Discharge status: Home / Self Care | Payer: Self-pay | Source: Ambulatory Visit | Attending: Oncology | Admitting: Oncology

## 2019-05-15 ENCOUNTER — Other Ambulatory Visit (HOSPITAL_COMMUNITY): Payer: Self-pay | Admitting: Oncology

## 2019-05-15 ENCOUNTER — Inpatient Hospital Stay (HOSPITAL_BASED_OUTPATIENT_CLINIC_OR_DEPARTMENT_OTHER): Payer: BC Managed Care – PPO | Admitting: Oncology

## 2019-05-15 VITALS — BP 151/87 | HR 87 | Temp 98.5°F | Resp 18 | Ht 63.0 in | Wt 200.2 lb

## 2019-05-15 DIAGNOSIS — C801 Malignant (primary) neoplasm, unspecified: Secondary | ICD-10-CM | POA: Diagnosis not present

## 2019-05-15 DIAGNOSIS — C786 Secondary malignant neoplasm of retroperitoneum and peritoneum: Secondary | ICD-10-CM

## 2019-05-15 DIAGNOSIS — D6959 Other secondary thrombocytopenia: Secondary | ICD-10-CM | POA: Diagnosis not present

## 2019-05-15 DIAGNOSIS — T451X5A Adverse effect of antineoplastic and immunosuppressive drugs, initial encounter: Secondary | ICD-10-CM

## 2019-05-15 DIAGNOSIS — E039 Hypothyroidism, unspecified: Secondary | ICD-10-CM

## 2019-05-15 DIAGNOSIS — B370 Candidal stomatitis: Secondary | ICD-10-CM

## 2019-05-15 DIAGNOSIS — C800 Disseminated malignant neoplasm, unspecified: Secondary | ICD-10-CM

## 2019-05-15 DIAGNOSIS — D701 Agranulocytosis secondary to cancer chemotherapy: Secondary | ICD-10-CM | POA: Diagnosis not present

## 2019-05-15 DIAGNOSIS — Z79899 Other long term (current) drug therapy: Secondary | ICD-10-CM

## 2019-05-15 NOTE — Telephone Encounter (Signed)
Scheduled appt per 7/17 los.  Patient declined calendar and avs.

## 2019-05-15 NOTE — Progress Notes (Signed)
[place] OFFICE PROGRESS NOTE   Diagnosis: Colon cancer  INTERVAL HISTORY:   Veronica Stuart completed another cycle of FOLFOX on 04/23/2019.  Cold sensitivity lasted 4-5 days following chemotherapy.  She reports altered taste.  No pain.  She had nausea following chemotherapy, but no vomiting.  No neuropathy symptoms at present. She saw Dr. Clovis Riley on 05/13/2019.  Restaging CTs revealed a decrease in the size of the cecal mass and peritoneal implants.  Left hydroureteronephrosis has resolved.  A 4 mm left lower lobe nodule is unchanged.  Objective:  Vital signs in last 24 hours:  Blood pressure (!) 151/87, pulse 87, temperature 98.5 F (36.9 C), temperature source Oral, resp. rate 18, height 5\' 3"  (1.6 m), weight 200 lb 3.2 oz (90.8 kg), SpO2 100 %.   Limited physical examination secondary to distancing with the COVID pandemic HEENT: Mild white coat over the tongue, no buccal thrush, no ulcers Lymphatics: No cervical, supraclavicular, axillary, or inguinal nodes GI: No hepatosplenomegaly, no mass, nontender Vascular: No leg edema    Portacath/PICC-without erythema  Lab Results:  Lab Results  Component Value Date   WBC 11.2 (H) 04/23/2019   HGB 14.1 04/23/2019   HCT 42.4 04/23/2019   MCV 89.3 04/23/2019   PLT 108 (L) 04/23/2019   NEUTROABS 7.6 04/23/2019    CMP  Lab Results  Component Value Date   NA 140 04/23/2019   K 4.0 04/23/2019   CL 106 04/23/2019   CO2 23 04/23/2019   GLUCOSE 93 04/23/2019   BUN 12 04/23/2019   CREATININE 0.84 04/23/2019   CALCIUM 9.0 04/23/2019   PROT 7.3 04/23/2019   ALBUMIN 4.0 04/23/2019   AST 47 (H) 04/23/2019   ALT 43 04/23/2019   ALKPHOS 203 (H) 04/23/2019   BILITOT 0.5 04/23/2019   GFRNONAA >60 04/23/2019   GFRAA >60 04/23/2019    Lab Results  Component Value Date   CEA1 2.91 04/09/2019     Medications: I have reviewed the patient's current medications.   Assessment/Plan: 1. 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 Numerous soft tissue implants throughout the visualized lower mesentery with a dominant 4.4 cm right lower quadrant peritoneal implant. Small volume pelvic ascites. 3.8 cm right ovarian mass.  01/15/2019-CEA 8  01/21/2019-CT biopsy of omental thickening left lower abdomen; pathology showed metastatic adenocarcinoma, positive for CDX-2, cytokeratin 20 and cytokeratin 7. Tumor negative for GATA-3, NapsinA, PAX 8, TTF-1, WT-1 and ER.  CTs 01/29/2019-extensive omental caking, dominant mass at the cecum, mild ascites, mild left hydronephrosis and hydroureter, indeterminate pulmonary nodules, borderline prominent left subpectoral node and upper normal size right supraclavicular node, borderline prominent aortocaval node  Cycle 1 FOLFOX 02/05/2019  Cycle 2 FOLFOX 02/19/2019 (oxaliplatin dose reduced due to mild neutropenia)  Cycle 3 FOLFOX 03/12/2019, Neulasta added  Cycle 4 FOLFOX 03/26/2019  Cycle 5 FOLFOX 04/09/2019  Cycle 6 FOLFOX 04/23/2019  CTs at [facility] 05/13/2019- decrease in the cecal mass and peritoneal implants, resolution of left hydroureteronephrosis, stable 4 mm left lower lobe nodule 2. Intermittent nausea/vomiting, abdominal pain-question obstructive symptoms 3. Hypothyroidism 4. Port-A-Cath placement 02/02/2019 5. Mild left hydronephrosis noted on CT 01/29/2019 6. Oral candidiasis. She will complete a 4-day course of Diflucan. 7. Neutropenia/thrombocytopenia secondary to chemotherapy- G-CSF support  added with cycle 3 FOLFOX, oxaliplatin dose reduced beginning with cycle 2     Disposition: Veronica Stuart has completed 6 cycles of FOLFOX.   The restaging CTs confirmed a response to therapy.  She saw Dr. Clovis Riley and has been scheduled for cytoreductive surgery and intraperitoneal chemotherapy on 06/02/2019.  She will return for an office visit here during the week of 06/22/2019.  We will decide on further chemotherapy based on the operative findings  Veronica Coder, MD  05/15/2019  9:26 AM

## 2019-05-18 ENCOUNTER — Ambulatory Visit: Payer: BC Managed Care – PPO | Admitting: Oncology

## 2019-05-21 ENCOUNTER — Other Ambulatory Visit: Payer: Self-pay | Admitting: Family Medicine

## 2019-05-21 DIAGNOSIS — E039 Hypothyroidism, unspecified: Secondary | ICD-10-CM

## 2019-05-21 NOTE — Telephone Encounter (Signed)
Dettinger. NTBS TSH 10/2017

## 2019-05-22 MED ORDER — LEVOTHYROXINE SODIUM 88 MCG PO TABS: ORAL_TABLET | ORAL | 3 refills | Status: DC

## 2019-05-22 NOTE — Addendum Note (Signed)
Addended by: Thana Ates on: 05/22/2019 10:55 AM   Modules accepted: Orders

## 2019-05-22 NOTE — Telephone Encounter (Signed)
Patient had tsh checked with oncology at [facility] and is viewable under care everywhere. Can we refill thyroid medication?

## 2019-06-22 ENCOUNTER — Ambulatory Visit: Payer: BC Managed Care – PPO | Admitting: Oncology

## 2019-06-24 MED ORDER — CVS EAR DROPS OT
40.00 | OTIC | Status: DC
Start: 2019-06-25 — End: 2019-06-24

## 2019-06-24 MED ORDER — Medication
Status: DC
Start: ? — End: 2019-06-24

## 2019-06-24 MED ORDER — OLANZAPINE-FLUOXETINE HCL 6-50 MG PO CAPS
6.00 | ORAL_CAPSULE | ORAL | Status: DC
Start: 2019-06-24 — End: 2019-06-24

## 2019-06-24 MED ORDER — PEDIASURE 1.0 CAL/FIBER PO LIQD
2.00 | ORAL | Status: DC
Start: ? — End: 2019-06-24

## 2019-06-24 MED ORDER — SENIOR TABS PO TABS
1.00 | ORAL_TABLET | ORAL | Status: DC
Start: 2019-06-24 — End: 2019-06-24

## 2019-06-24 MED ORDER — ACETAMINOPHEN 80 MG PO CPSP
0.50 | ORAL_CAPSULE | ORAL | Status: DC
Start: ? — End: 2019-06-24

## 2019-06-24 MED ORDER — METHYLPHENIDATE HCL POWD
50.00 | Status: DC
Start: ? — End: 2019-06-24

## 2019-06-24 MED ORDER — STRI-DEX MAXIMUM STRENGTH 2 % EX PADS
125.00 | MEDICATED_PAD | CUTANEOUS | Status: DC
Start: ? — End: 2019-06-24

## 2019-06-24 MED ORDER — RA PAIN RELIEVER PM PO
ORAL | Status: DC
Start: 2019-06-24 — End: 2019-06-24

## 2019-06-24 MED ORDER — NIFEDIAC CC PO
1.00 | ORAL | Status: DC
Start: ? — End: 2019-06-24

## 2019-06-24 MED ORDER — CVS CHILDRENS NASAL DECONGEST 15 MG/5ML PO LIQD
88.00 | ORAL | Status: DC
Start: 2019-06-25 — End: 2019-06-24

## 2019-06-24 MED ORDER — AVELOX 400 MG PO TABS
1.00 | ORAL_TABLET | ORAL | Status: DC
Start: 2019-06-25 — End: 2019-06-24

## 2019-06-24 MED ORDER — COMPOUND W FREEZE OFF EX AERO
2.00 | INHALATION_SPRAY | CUTANEOUS | Status: DC
Start: 2019-06-25 — End: 2019-06-24

## 2019-06-24 MED ORDER — Medication
30.00 | Status: DC
Start: ? — End: 2019-06-24

## 2019-06-24 MED ORDER — FOSPHENYTOIN SODIUM 50 MG PE/ML IJ SOLN
15.00 | INTRAMUSCULAR | Status: DC
Start: ? — End: 2019-06-24

## 2019-06-24 MED ORDER — BAYER WOMENS 81-300 MG PO TABS
40.00 | ORAL_TABLET | ORAL | Status: DC
Start: 2019-06-24 — End: 2019-06-24

## 2019-06-24 MED ORDER — DEWITTS PAIN RELIEVER 325 MG PO TABS
10.00 | ORAL_TABLET | ORAL | Status: DC
Start: ? — End: 2019-06-24

## 2019-06-24 MED ORDER — Medication
20.00 | Status: DC
Start: ? — End: 2019-06-24

## 2019-06-24 MED ORDER — Medication
600.00 | Status: DC
Start: 2019-06-25 — End: 2019-06-24

## 2019-06-24 MED ORDER — EMBRACE PUMP SET-PIERCING PIN MISC
1.00 | Status: DC
Start: ? — End: 2019-06-24

## 2019-06-24 MED ORDER — Medication
500.00 | Status: DC
Start: ? — End: 2019-06-24

## 2019-06-24 MED ORDER — QUINERVA 260 MG PO TABS
325.00 | ORAL_TABLET | ORAL | Status: DC
Start: 2019-06-25 — End: 2019-06-24

## 2019-06-24 MED ORDER — PCCA BIOPEPTIDE BASE EX CREA
300.00 | TOPICAL_CREAM | CUTANEOUS | Status: DC
Start: 2019-06-24 — End: 2019-06-24

## 2019-06-24 MED ORDER — Medication
20.00 | Status: DC
Start: 2019-06-25 — End: 2019-06-24

## 2019-07-19 MED ORDER — OLANZAPINE-FLUOXETINE HCL 6-50 MG PO CAPS
6.00 | ORAL_CAPSULE | ORAL | Status: DC
Start: 2019-07-19 — End: 2019-07-19

## 2019-07-19 MED ORDER — Medication
15.00 | Status: DC
Start: ? — End: 2019-07-19

## 2019-07-19 MED ORDER — CVS EAR DROPS OT
40.00 | OTIC | Status: DC
Start: 2019-07-19 — End: 2019-07-19

## 2019-07-19 MED ORDER — RA PAIN RELIEVER PM PO
ORAL | Status: DC
Start: ? — End: 2019-07-19

## 2019-07-19 MED ORDER — Medication
500.00 | Status: DC
Start: ? — End: 2019-07-19

## 2019-07-19 MED ORDER — ESTROPLUS PO TABS
1.00 | ORAL_TABLET | ORAL | Status: DC
Start: ? — End: 2019-07-19

## 2019-07-19 MED ORDER — SENIOR TABS PO TABS
1.00 | ORAL_TABLET | ORAL | Status: DC
Start: 2019-07-19 — End: 2019-07-19

## 2019-07-19 MED ORDER — PEDIASURE 1.0 CAL/FIBER PO LIQD
2.00 | ORAL | Status: DC
Start: 2019-07-19 — End: 2019-07-19

## 2019-07-19 MED ORDER — PAIN PM EXTRA STRENGTH 500-25 MG PO TABS
1.00 | ORAL_TABLET | ORAL | Status: DC
Start: 2019-07-19 — End: 2019-07-19

## 2019-07-19 MED ORDER — GENERIC EXTERNAL MEDICATION
20.00 | Status: DC
Start: ? — End: 2019-07-19

## 2019-07-19 MED ORDER — Medication
25.00 | Status: DC
Start: 2019-07-20 — End: 2019-07-19

## 2019-07-19 MED ORDER — CVS CHILDRENS NASAL DECONGEST 15 MG/5ML PO LIQD
88.00 | ORAL | Status: DC
Start: 2019-07-20 — End: 2019-07-19

## 2019-07-19 MED ORDER — GENERIC EXTERNAL MEDICATION
Status: DC
Start: ? — End: 2019-07-19

## 2019-07-19 MED ORDER — SODIUM CHLORIDE 0.9 % IV SOLN
INTRAVENOUS | Status: DC
Start: ? — End: 2019-07-19

## 2019-07-19 MED ORDER — ACETAMINOPHEN 80 MG PO CPSP
0.50 | ORAL_CAPSULE | ORAL | Status: DC
Start: ? — End: 2019-07-19

## 2019-07-19 MED ORDER — DEWITTS PAIN RELIEVER 325 MG PO TABS
10.00 | ORAL_TABLET | ORAL | Status: DC
Start: ? — End: 2019-07-19

## 2019-07-19 MED ORDER — LOSARTAN POTASSIUM (ANGIOTENSIN II RECEPTOR ANTAGONISTS)
20.00 | Status: DC
Start: 2019-07-19 — End: 2019-07-19

## 2019-07-19 MED ORDER — Medication
20.00 | Status: DC
Start: 2019-07-20 — End: 2019-07-19

## 2019-07-19 MED ORDER — RA COLD SORE TREATMENT EX
2.00 | CUTANEOUS | Status: DC
Start: ? — End: 2019-07-19

## 2019-07-19 MED ORDER — Medication
8.00 | Status: DC
Start: 2019-07-19 — End: 2019-07-19

## 2019-07-19 MED ORDER — Medication
15.00 | Status: DC
Start: 2019-07-19 — End: 2019-07-19

## 2019-08-15 ENCOUNTER — Other Ambulatory Visit: Payer: Self-pay | Admitting: Family

## 2019-08-15 DIAGNOSIS — E039 Hypothyroidism, unspecified: Secondary | ICD-10-CM

## 2019-08-18 ENCOUNTER — Ambulatory Visit: Payer: BC Managed Care – PPO | Admitting: Family

## 2019-08-21 ENCOUNTER — Other Ambulatory Visit (HOSPITAL_COMMUNITY)
Admission: RE | Admit: 2019-08-21 | Discharge: 2019-08-21 | Disposition: A | Discharge status: Home / Self Care | Payer: BC Managed Care – PPO | Source: Other Acute Inpatient Hospital | Attending: Internal Medicine | Admitting: Internal Medicine

## 2019-08-21 DIAGNOSIS — K56609 Unspecified intestinal obstruction, unspecified as to partial versus complete obstruction: Secondary | ICD-10-CM | POA: Diagnosis not present

## 2019-08-21 LAB — BASIC METABOLIC PANEL
Anion gap: 13 (ref 5–15)
BUN: 21 mg/dL — ABNORMAL HIGH (ref 6–20)
CO2: 44 mmol/L — ABNORMAL HIGH (ref 22–32)
Calcium: 8.1 mg/dL — ABNORMAL LOW (ref 8.9–10.3)
Chloride: 74 mmol/L — ABNORMAL LOW (ref 98–111)
Creatinine, Ser: 0.55 mg/dL (ref 0.44–1.00)
GFR calc Af Amer: >60 mL/min (ref >60)
GFR calc non Af Amer: >60 mL/min (ref >60)
Glucose, Bld: 94 mg/dL (ref 70–99)
Potassium: 2.2 mmol/L — CL (ref 3.5–5.1)
Sodium: 131 mmol/L — ABNORMAL LOW (ref 135–145)

## 2019-08-21 LAB — PHOSPHORUS: Phosphorus: 3.5 mg/dL (ref 2.5–4.6)

## 2019-08-26 ENCOUNTER — Ambulatory Visit
Admission: RE | Admit: 2019-08-26 | Discharge: 2019-08-26 | Disposition: A | Discharge status: Home / Self Care | Payer: Self-pay | Source: Ambulatory Visit | Attending: Oncology | Admitting: Oncology

## 2019-08-26 ENCOUNTER — Telehealth: Payer: Self-pay | Admitting: *Deleted

## 2019-08-26 ENCOUNTER — Telehealth: Payer: Self-pay | Admitting: Oncology

## 2019-08-26 DIAGNOSIS — C800 Disseminated malignant neoplasm, unspecified: Secondary | ICD-10-CM

## 2019-08-26 MED ORDER — ACETAMINOPHEN 80 MG PO CPSP
0.50 | ORAL_CAPSULE | ORAL | Status: DC
Start: ? — End: 2019-08-26

## 2019-08-26 MED ORDER — CVS POLY BACITRACIN 500-10000 UNIT/GM EX OINT
10.00 | TOPICAL_OINTMENT | CUTANEOUS | Status: DC
Start: 2019-08-26 — End: 2019-08-26

## 2019-08-26 MED ORDER — GENERIC EXTERNAL MEDICATION
Status: DC
Start: ? — End: 2019-08-26

## 2019-08-26 MED ORDER — CVS EAR DROPS OT
40.00 | OTIC | Status: DC
Start: 2019-08-28 — End: 2019-08-26

## 2019-08-26 MED ORDER — CVS CHILDRENS NASAL DECONGEST 15 MG/5ML PO LIQD
88.00 | ORAL | Status: DC
Start: 2019-08-28 — End: 2019-08-26

## 2019-08-26 MED ORDER — QUINERVA 260 MG PO TABS
650.00 | ORAL_TABLET | ORAL | Status: DC
Start: ? — End: 2019-08-26

## 2019-08-26 MED ORDER — ASTELIN 137 MCG/SPRAY NA SOLN
40.00 | NASAL | Status: DC
Start: 2019-08-28 — End: 2019-08-26

## 2019-08-26 MED ORDER — Medication
30.00 | Status: DC
Start: ? — End: 2019-08-26

## 2019-08-26 MED ORDER — ESTROPLUS PO TABS
1.00 | ORAL_TABLET | ORAL | Status: DC
Start: ? — End: 2019-08-26

## 2019-08-26 NOTE — Telephone Encounter (Signed)
Per Dr. Benay Spice: Requested CT scan abd/pelvis from 08/25/19 at [facility] be pushed over to Epic for MD to view. Orders entered for outside films to be imported. Schedule to see NP 11/9 at 10:15 or Dr. Benay Spice on 11/12 at 2:45pm. Called patient she is already scheduled for 11/3 and she prefers to keep this for now.

## 2019-08-26 NOTE — Telephone Encounter (Signed)
R/s appt per 10/28 sch message - pt aware of appt date and time   

## 2019-08-26 NOTE — Telephone Encounter (Signed)
Patient will need f/u appointment with Dr. Benay Spice. Recent CT scan 08/25/2019 shows new pulmonary nodules and lesions on liver concerning for metastatic disease. Currently at home on TPN following [facility]/surgery 06/02/19. Required surgery 21 days later due to perforation of small bowel.

## 2019-08-27 MED ORDER — GENERIC EXTERNAL MEDICATION
Status: DC
Start: ? — End: 2019-08-27

## 2019-09-01 ENCOUNTER — Other Ambulatory Visit: Payer: Self-pay

## 2019-09-01 ENCOUNTER — Inpatient Hospital Stay: Payer: BC Managed Care – PPO | Attending: Oncology | Admitting: Oncology

## 2019-09-01 VITALS — BP 120/84 | HR 103 | Temp 98.0°F | Resp 17 | Ht 63.0 in

## 2019-09-01 DIAGNOSIS — C7961 Secondary malignant neoplasm of right ovary: Secondary | ICD-10-CM | POA: Insufficient documentation

## 2019-09-01 DIAGNOSIS — C785 Secondary malignant neoplasm of large intestine and rectum: Secondary | ICD-10-CM | POA: Diagnosis not present

## 2019-09-01 DIAGNOSIS — R531 Weakness: Secondary | ICD-10-CM | POA: Diagnosis not present

## 2019-09-01 DIAGNOSIS — E039 Hypothyroidism, unspecified: Secondary | ICD-10-CM | POA: Diagnosis not present

## 2019-09-01 DIAGNOSIS — C800 Disseminated malignant neoplasm, unspecified: Secondary | ICD-10-CM

## 2019-09-01 DIAGNOSIS — R112 Nausea with vomiting, unspecified: Secondary | ICD-10-CM | POA: Diagnosis not present

## 2019-09-01 DIAGNOSIS — Z452 Encounter for adjustment and management of vascular access device: Secondary | ICD-10-CM | POA: Diagnosis not present

## 2019-09-01 DIAGNOSIS — C801 Malignant (primary) neoplasm, unspecified: Secondary | ICD-10-CM | POA: Diagnosis present

## 2019-09-01 NOTE — Progress Notes (Signed)
Veronica Stuart   Diagnosis: Colon cancer  INTERVAL HISTORY:   Veronica Stuart underwent cytoreductive surgery and intraperitoneal mitomycin-C chemotherapy on 06/02/2019.  She had a R2a resection.  Pathology included resection of the right colon.  The pathology confirmed a moderate to poorly differentiated adenocarcinoma involving the appendix, right colon, and extending to the right fallopian tube, right ovary, and omentum.  The distal colon resection margin was involved by tumor.  Lymphovascular invasion was present.  Metastatic adenocarcinoma was noted in 4 of 26 lymph nodes.  Multiple tumor deposits.  Dripping from the bladder and left colonic gutter revealed metastatic adenocarcinoma.  Resection from the rectosigmoid, uterus, left tube and ovary revealed invasive adenocarcinoma with tumor deposits in the pericolorectal fibroadipose tissue.  A partial ileectomy revealed invasive adenocarcinoma involving.  Has no fibroadipose tissue.  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  Her postoperative course was complicated by bowel perforation requiring a repeat small bowel resection.  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  She was readmitted on 08/22/2019 with hypokalemia and refractory nausea/vomiting.  She was discharged home on 08/27/2019.  She continues home TPN via the Port-A-Cath. A CT abdomen/pelvis on 08/25/2019 revealed an improving abscess.  She underwent exchange of the abscess drainage catheter while admitted.  The CT on 08/25/2019 revealed enlargement of multiple mesenteric and gastrohepatic ligament lymph nodes compared with CT from 07/07/2019.  New pulmonary nodules and capsular lesions at the anterior aspect of the left hepatic lobe were concerning for metastatic disease.  Veronica Stuart is here today with  her daughter.  She remains weak.  She is ambulatory with a walker.  She reports not having a bowel movement for several months.  She has constant nausea and intermittent emesis.  She drinks water, but is not eating.  She continues TPN.  She has abdominal pain.  Multiple medications including lorazepam have not helped the nausea. Objective:  Vital signs in last 24 hours:  Blood pressure (!) 144/100, pulse (!) 103, temperature 98 F (36.7 C), temperature source Temporal, resp. rate 17, height 5\' 3"  (1.6 m), SpO2 100 %.    HEENT: No thrush Resp: Lungs clear bilaterally Cardio: Regular rate and rhythm GI: Soft, right lower quadrant drain, the midline wound is open with a packing in place Vascular: No leg edema   Portacath/PICC-without erythema  Lab Results:  Lab Results  Component Value Date   WBC 11.2 (H) 04/23/2019   HGB 14.1 04/23/2019   HCT 42.4 04/23/2019   MCV 89.3 04/23/2019   PLT 108 (L) 04/23/2019   NEUTROABS 7.6 04/23/2019    CMP  Lab Results  Component Value Date   NA 131 (L) 08/21/2019   K 2.2 (LL) 08/21/2019   CL 74 (L) 08/21/2019   CO2 44 (H) 08/21/2019   GLUCOSE 94 08/21/2019   BUN 21 (H) 08/21/2019   CREATININE 0.55 08/21/2019   CALCIUM 8.1 (L) 08/21/2019   PROT 7.3 04/23/2019   ALBUMIN 4.0 04/23/2019   AST 47 (H) 04/23/2019   ALT 43 04/23/2019   ALKPHOS 203 (H) 04/23/2019   BILITOT 0.5 04/23/2019   GFRNONAA >60 08/21/2019   GFRAA >60 08/21/2019    Lab Results  Component Value Date   CEA1 2.91 04/09/2019    Lab Results  Component Value Date   INR 1.0 02/02/2019    Imaging:  No results  found.  Medications: I have reviewed the patient's current medications.   Assessment/Plan: 1. Metastatic adenocarcinoma with carcinomatosis-probable colon cancer-cecum  01/09/2019 ultrasound-soft tissue slightly vascular mass noted in the right adnexa measuring 3.1 x 2.5 x 2.5 cm  01/09/2019- CA125 40.6   MRI pelvis 01/13/2019-diffuse omental soft  tissue caking; diffuse peritoneal thickening, nodularity and enhancement throughout the pelvis and paracolic gutters. Numerous soft tissue implants throughout the visualized lower mesentery with a dominant 4.4 cm right lower quadrant peritoneal implant. Small volume pelvic ascites. 3.8 cm right ovarian mass.  01/15/2019-CEA 8  01/21/2019-CT biopsy of omental thickening left lower abdomen; pathology showed metastatic adenocarcinoma, positive for CDX-2, cytokeratin 20 and cytokeratin 7. Tumor negative for GATA-3, NapsinA, PAX 8, TTF-1, WT-1 and ER.  CTs 01/29/2019-extensive omental caking, dominant mass at the cecum, mild ascites, mild left hydronephrosis and hydroureter, indeterminate pulmonary nodules, borderline prominent left subpectoral node and upper normal size right supraclavicular node, borderline prominent aortocaval node  Cycle 1 FOLFOX 02/05/2019  Cycle 2 FOLFOX 02/19/2019 (oxaliplatin dose reduced due to mild neutropenia)  Cycle 3 FOLFOX 03/12/2019, Neulasta added  Cycle 4 FOLFOX 03/26/2019  Cycle 5 FOLFOX 04/09/2019  Cycle 6 FOLFOX 04/23/2019  CTs at [facility] 05/13/2019- decrease in the cecal mass and peritoneal implants, resolution of left hydroureteronephrosis, stable 4 mm left lower lobe nodule  Cytoreductive surgery with intraperitoneal chemotherapy (mitomycin-C) by Dr. Clovis Riley on 06/02/2019 included a right colectomy, omentectomy, total abdominal hysterectomy, BSO, cholecystectomy, resection of the rectum, mobilization of splenic flexure.  R2a resection  CT at [facility] 08/25/2019-new nodules and lesions along the anterior aspect of the left hepatic lobe capsule concerning for metastatic disease 2. Intermittent nausea/vomiting, abdominal pain-question obstructive symptoms 3. Hypothyroidism 4. Port-A-Cath placement 02/02/2019 5. Mild left hydronephrosis noted on CT 01/29/2019 6. Oral candidiasis. She will complete a 4-day course of Diflucan. 7. Neutropenia/thrombocytopenia  secondary to chemotherapy- G-CSF support added with cycle 3 FOLFOX, oxaliplatin dose reduced beginning with cycle 2 8. Bowel perforation requiring repeat small bowel resection August 2020, postoperative abscess 9. Persistent nausea and intermittent vomiting-etiology unclear     Disposition: Veronica Stuart is recovering from cytoreductive surgery with intraperitoneal mitomycin-C.  She had a prolonged and complicated postoperative course.  The metastatic adenocarcinoma appears to have started in the appendix versus right colon.  She underwent incomplete surgical resection.  The CT on 08/25/2019 suggest the possibility of progressive carcinomatosis and new lung nodules.  It is very likely the cancer will progress over the next several months, but she is not a candidate for systemic therapy at present.  I reviewed the [facility] CT images.  She is scheduled for follow-up at [facility] tomorrow.  She will discuss the constipation and persistent nausea with Dr. Clovis Riley.  I asked her to discuss a laxative regimen with him.  I would also consider a trial of Decadron for the nausea.  She will continue home TPN and postoperative care with Dr. Clovis Riley.   She will return for an office visit in approximately 4 weeks.  We will plan for a restaging CT evaluation at a 2-22-month interval.  40 minutes were spent with the patient today.  The majority of the time was used for counseling and coordination of care.   Betsy Coder, MD  09/01/2019  11:53 AM

## 2019-09-02 ENCOUNTER — Telehealth: Payer: Self-pay | Admitting: Oncology

## 2019-09-02 NOTE — Telephone Encounter (Signed)
Scheduled per los. Called and left msg. Mailed printout  °

## 2019-09-03 ENCOUNTER — Encounter: Payer: Self-pay | Admitting: *Deleted

## 2019-09-03 NOTE — Progress Notes (Signed)
Patient emailed blank FMLA form and provided contact [phone] att" April Fargis to fax completed form. Needs to extend FMLA beginning 08/30/2019. Forwarded forms to disability specialist inbox.

## 2019-09-07 ENCOUNTER — Other Ambulatory Visit: Payer: Self-pay | Admitting: Family

## 2019-09-10 ENCOUNTER — Other Ambulatory Visit: Payer: Self-pay | Admitting: Family

## 2019-09-10 DIAGNOSIS — E039 Hypothyroidism, unspecified: Secondary | ICD-10-CM

## 2019-09-10 NOTE — Telephone Encounter (Signed)
[employer] NTBS 30 days given 08/17/19

## 2019-09-10 NOTE — Telephone Encounter (Signed)
Patient does not need refill at this time

## 2019-09-14 ENCOUNTER — Other Ambulatory Visit: Payer: Self-pay

## 2019-09-14 ENCOUNTER — Other Ambulatory Visit (HOSPITAL_COMMUNITY)
Admission: RE | Admit: 2019-09-14 | Discharge: 2019-09-14 | Disposition: A | Discharge status: Home / Self Care | Payer: BC Managed Care – PPO | Source: Ambulatory Visit | Attending: Surgical Oncology | Admitting: Surgical Oncology

## 2019-09-14 ENCOUNTER — Inpatient Hospital Stay: Payer: BC Managed Care – PPO

## 2019-09-14 ENCOUNTER — Telehealth: Payer: Self-pay | Admitting: *Deleted

## 2019-09-14 DIAGNOSIS — C799 Secondary malignant neoplasm of unspecified site: Secondary | ICD-10-CM

## 2019-09-14 DIAGNOSIS — D860 Sarcoidosis of lung: Secondary | ICD-10-CM | POA: Diagnosis present

## 2019-09-14 DIAGNOSIS — Z95828 Presence of other vascular implants and grafts: Secondary | ICD-10-CM

## 2019-09-14 DIAGNOSIS — C801 Malignant (primary) neoplasm, unspecified: Secondary | ICD-10-CM | POA: Diagnosis not present

## 2019-09-14 LAB — CBC WITH DIFFERENTIAL/PLATELET
Abs Immature Granulocytes: 0.31 10*3/uL — ABNORMAL HIGH (ref 0.00–0.07)
Basophils Absolute: 0.1 10*3/uL (ref 0.0–0.1)
Basophils Relative: 1 %
Eosinophils Absolute: 0.1 10*3/uL (ref 0.0–0.5)
Eosinophils Relative: 0 %
HCT: 38.1 % (ref 36.0–46.0)
Hemoglobin: 12.3 g/dL (ref 12.0–15.0)
Immature Granulocytes: 3 %
Lymphocytes Relative: 10 %
Lymphs Abs: 1.3 10*3/uL (ref 0.7–4.0)
MCH: 28.1 pg (ref 26.0–34.0)
MCHC: 32.3 g/dL (ref 30.0–36.0)
MCV: 87 fL (ref 80.0–100.0)
Monocytes Absolute: 0.8 10*3/uL (ref 0.1–1.0)
Monocytes Relative: 6 %
Neutro Abs: 10 10*3/uL — ABNORMAL HIGH (ref 1.7–7.7)
Neutrophils Relative %: 80 %
Platelets: 301 10*3/uL (ref 150–400)
RBC: 4.38 MIL/uL (ref 3.87–5.11)
RDW: 17.4 % — ABNORMAL HIGH (ref 11.5–15.5)
WBC: 12.5 10*3/uL — ABNORMAL HIGH (ref 4.0–10.5)
nRBC: 0 % (ref 0.0–0.2)

## 2019-09-14 LAB — BASIC METABOLIC PANEL
Anion gap: 12 (ref 5–15)
BUN: 31 mg/dL — ABNORMAL HIGH (ref 6–20)
CO2: 22 mmol/L (ref 22–32)
Calcium: 8.5 mg/dL — ABNORMAL LOW (ref 8.9–10.3)
Chloride: 96 mmol/L — ABNORMAL LOW (ref 98–111)
Creatinine, Ser: 0.67 mg/dL (ref 0.44–1.00)
GFR calc Af Amer: >60 mL/min (ref >60)
GFR calc non Af Amer: >60 mL/min (ref >60)
Glucose, Bld: 102 mg/dL — ABNORMAL HIGH (ref 70–99)
Potassium: 4.1 mmol/L (ref 3.5–5.1)
Sodium: 130 mmol/L — ABNORMAL LOW (ref 135–145)

## 2019-09-14 LAB — PREALBUMIN: Prealbumin: 16.8 mg/dL — ABNORMAL LOW (ref 18–38)

## 2019-09-14 LAB — PHOSPHORUS: Phosphorus: 5.5 mg/dL — ABNORMAL HIGH (ref 2.5–4.6)

## 2019-09-14 LAB — MAGNESIUM: Magnesium: 2 mg/dL (ref 1.7–2.4)

## 2019-09-14 LAB — ALBUMIN: Albumin: 2.5 g/dL — ABNORMAL LOW (ref 3.5–5.0)

## 2019-09-14 MED ORDER — SODIUM CHLORIDE 0.9% FLUSH
10.0000 mL | Freq: Once | INTRAVENOUS | Status: AC
  Administered 2019-09-14: 10 mL
  Filled 2019-09-14: qty 10

## 2019-09-14 MED ORDER — HEPARIN SOD (PORK) LOCK FLUSH 100 UNIT/ML IV SOLN
500.0000 [IU] | Freq: Once | INTRAVENOUS | Status: AC
  Administered 2019-09-14: 500 [IU]
  Filled 2019-09-14: qty 5

## 2019-09-14 NOTE — Progress Notes (Signed)
Flushed patients port and was able to get blood return from port right away with no issues. No cathflo given today.

## 2019-09-14 NOTE — Telephone Encounter (Signed)
Unable to obtain blood return from [facility] despite reaccess x 2 and several push/pull flushes and position changes. They are not able to administer TPA in home.  Will have flush appointment made today here for TPA to be instilled and wait for blood return before sending home. Patient agrees and requests her daughter be called w/appointment at [phone]. High priority scheduling message sent.

## 2019-09-24 ENCOUNTER — Encounter: Payer: Self-pay | Admitting: Oncology

## 2019-09-27 ENCOUNTER — Encounter: Payer: Self-pay | Admitting: Oncology

## 2019-09-28 ENCOUNTER — Encounter: Payer: Self-pay | Admitting: Oncology

## 2019-09-28 ENCOUNTER — Inpatient Hospital Stay (HOSPITAL_BASED_OUTPATIENT_CLINIC_OR_DEPARTMENT_OTHER): Payer: BC Managed Care – PPO | Admitting: Oncology

## 2019-09-28 DIAGNOSIS — C799 Secondary malignant neoplasm of unspecified site: Secondary | ICD-10-CM | POA: Diagnosis not present

## 2019-09-28 MED ORDER — HYDROMORPHONE HCL 1 MG/ML PO LIQD
1.00 | ORAL | Status: DC
Start: ? — End: 2019-09-28

## 2019-09-28 MED ORDER — LORAZEPAM 0.5 MG PO TABS
0.50 | ORAL_TABLET | ORAL | Status: DC
Start: ? — End: 2019-09-28

## 2019-09-28 MED ORDER — GENERIC EXTERNAL MEDICATION
Status: DC
Start: 2019-09-28 — End: 2019-09-28

## 2019-09-28 MED ORDER — PHENOL 1.4 % MT LIQD
1.00 | OROMUCOSAL | Status: DC
Start: ? — End: 2019-09-28

## 2019-09-28 MED ORDER — SODIUM CHLORIDE 0.9 % IV SOLN
INTRAVENOUS | Status: DC
Start: ? — End: 2019-09-28

## 2019-09-28 MED ORDER — LEVOTHYROXINE SODIUM 88 MCG PO TABS
88.00 | ORAL_TABLET | ORAL | Status: DC
Start: 2019-10-03 — End: 2019-09-28

## 2019-09-28 MED ORDER — OLANZAPINE 5 MG PO TBDP
5.00 | ORAL_TABLET | ORAL | Status: DC
Start: 2019-10-02 — End: 2019-09-28

## 2019-09-28 MED ORDER — NALOXONE HCL 0.4 MG/ML IJ SOLN
0.40 | INTRAMUSCULAR | Status: DC
Start: ? — End: 2019-09-28

## 2019-09-28 MED ORDER — OXYCODONE HCL 5 MG/5ML PO SOLN
5.00 | ORAL | Status: DC
Start: ? — End: 2019-09-28

## 2019-09-28 MED ORDER — GENERIC EXTERNAL MEDICATION
50.00 | Status: DC
Start: ? — End: 2019-09-28

## 2019-09-28 MED ORDER — PANTOPRAZOLE SODIUM 40 MG PO TBEC
40.00 | DELAYED_RELEASE_TABLET | ORAL | Status: DC
Start: 2019-10-02 — End: 2019-09-28

## 2019-09-28 MED ORDER — GENERIC EXTERNAL MEDICATION
15.00 | Status: DC
Start: 2019-09-29 — End: 2019-09-28

## 2019-09-28 MED ORDER — DEXAMETHASONE SOD PHOSPHATE PF 10 MG/ML IJ SOLN
8.00 | INTRAMUSCULAR | Status: DC
Start: 2019-09-29 — End: 2019-09-28

## 2019-09-28 MED ORDER — FENTANYL 12 MCG/HR TD PT72
1.00 | MEDICATED_PATCH | TRANSDERMAL | Status: DC
Start: 2019-09-29 — End: 2019-09-28

## 2019-09-28 NOTE — Progress Notes (Signed)
[facility] OFFICE VISIT PROGRESS NOTE  I connected with [name]  on 09/28/19 at  4 PM EST by telephone and verified that I am speaking with the correct person using two identifiers.   I discussed the limitations, risks, security and privacy concerns of performing an evaluation and management service by telemedicine and the availability of in-person appointments. I also discussed with the patient that there may be a patient responsible charge related to this service. The patient expressed understanding and agreed to proceed.    Patient's location: Hospital Provider's location: Office   Diagnosis: Adenocarcinoma of the appendix  INTERVAL HISTORY:   [name] is seen today for a telehealth visit.  The telehealth visit is per her request as she is currently hospitalized at [facility]. She was admitted 09/22/2019 with severe abdominal pain and nausea/vomiting.  CTs revealed progression of peritoneal disease there is mass-effect on the distal duodenum/jejunum with duodenal and gastric outlet obstruction.  Multiple groundglass pulmonary nodules are felt to represent aspiration as opposed to metastatic disease.  Bilateral hydroureteronephrosis secondary to mass-effect from the kidneys.  She reports significant relief of nausea and pain after placement of an NG tube yesterday.  She has been seen by Dr. Clovis Riley, infectious disease, and the palliative care services.  She is being treated for a multidrug-resistant Enterococcus urinary tract infection.  She reports the hospitalist team has discussed placement of a venting gastrostomy and changing to a double-lumen Port-A-Cath.   Lab Results:  Lab Results  Component Value Date   WBC 12.5 (H) 09/14/2019   HGB 12.3 09/14/2019   HCT 38.1 09/14/2019   MCV 87.0 09/14/2019   PLT 301 09/14/2019   NEUTROABS 10.0 (H) 09/14/2019     Medications: I have reviewed the patient's current  medications.  Assessment/Plan: 1. 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 Numerous soft tissue implants throughout the visualized lower mesentery with a dominant 4.4 cm right lower quadrant peritoneal implant. Small volume pelvic ascites. 3.8 cm right ovarian mass.  01/15/2019-CEA 8  01/21/2019-CT biopsy of omental thickening left lower abdomen; pathology showed metastatic adenocarcinoma, positive for CDX-2, cytokeratin 20 and cytokeratin 7. Tumor negative for GATA-3, NapsinA, PAX 8, TTF-1, WT-1 and ER.  CTs 01/29/2019-extensive omental caking, dominant mass at the cecum, mild ascites, mild left hydronephrosis and hydroureter, indeterminate pulmonary nodules, borderline prominent left subpectoral node and upper normal size right supraclavicular node, borderline prominent aortocaval node  Cycle 1 FOLFOX 02/05/2019  Cycle 2 FOLFOX 02/19/2019 (oxaliplatin dose reduced due to mild neutropenia)  Cycle 3 FOLFOX 03/12/2019, Neulasta added  Cycle 4 FOLFOX 03/26/2019  Cycle 5 FOLFOX 04/09/2019  Cycle 6 FOLFOX 04/23/2019  CTs at [facility] 05/13/2019- decrease in the cecal mass and peritoneal implants, resolution of left hydroureteronephrosis, stable 4 mm left lower lobe nodule  Cytoreductive surgery with intraperitoneal chemotherapy (mitomycin-C) by Dr. Clovis Riley on 06/02/2019 included a right colectomy, omentectomy, total abdominal hysterectomy, BSO, cholecystectomy, resection of the rectum, mobilization of splenic flexure.  R2a resection  CT at [facility] 08/25/2019-new nodules and lesions along the anterior aspect of the left hepatic lobe capsule concerning  for metastatic disease 2. Intermittent nausea/vomiting, abdominal pain-question obstructive symptoms 3. Hypothyroidism 4. Port-A-Cath placement  02/02/2019 5. Mild left hydronephrosis noted on CT 01/29/2019 6. Oral candidiasis. She will complete a 4-day course of Diflucan. 7. Neutropenia/thrombocytopenia secondary to chemotherapy- G-CSF support added with cycle 3 FOLFOX, oxaliplatin dose reduced beginning with cycle 2 8. Bowel perforation requiring repeat small bowel resection August 2020, postoperative abscess 9. Persistent nausea and intermittent vomiting-etiology unclear      Disposition: Ms. Gowans has clinical and radiologic evidence of disease progression.  She has metastatic adenocarcinoma, likely of appendiceal origin, with carcinomatosis.  The carcinomatosis has resulted in gastric outlet obstruction and hydroureteronephrosis.  I discussed the current situation and prognosis with Ms. Sulser.  She understands no therapy will be curative.  She does not appear to be a candidate for additional chemotherapy given her poor performance and nutritional status.  I agree with placement of a venting gastrostomy.  She appears to qualify for home hospice care.  I do not recommend placement of a double-lumen Port-A-Cath for IV antibiotics.  If she continued TPN, this could be cycled over part of the day and vancomycin administered while off of TPN.  She has a poor prognosis for survival beyond the next weeks to few months.  She will be scheduled for outpatient follow-up here on 10/08/2019.  I am available to see her sooner or discuss options by telephone in the interim.   I discussed the assessment and treatment plan with the patient. The patient was provided an opportunity to ask questions and all were answered. The patient agreed with the plan and demonstrated an understanding of the instructions.   The patient was advised to call back or seek an in-person evaluation if the symptoms worsen or  if the condition fails to improve as anticipated.  I provided 30 minutes of telephone, chart review, and documentation time during this encounter, and > 50% was spent counseling as documented under my assessment & plan.  Betsy Coder ANP/GNP-BC   09/28/2019 3:55 PM

## 2019-09-29 ENCOUNTER — Ambulatory Visit: Payer: BC Managed Care – PPO | Admitting: Oncology

## 2019-09-29 ENCOUNTER — Telehealth: Payer: Self-pay | Admitting: Oncology

## 2019-09-29 NOTE — Telephone Encounter (Signed)
Scheduled per los. Called and spoke with patient. Confirmed appt 

## 2019-10-01 ENCOUNTER — Encounter: Payer: Self-pay | Admitting: Oncology

## 2019-10-02 ENCOUNTER — Telehealth: Payer: Self-pay | Admitting: Nurse Practitioner

## 2019-10-02 MED ORDER — LOSARTAN POTASSIUM (ANGIOTENSIN II RECEPTOR ANTAGONISTS)
20.00 | Status: DC
Start: 2019-10-02 — End: 2019-10-02

## 2019-10-02 MED ORDER — CVS BATH OIL EX
0.40 | CUTANEOUS | Status: DC
Start: ? — End: 2019-10-02

## 2019-10-02 MED ORDER — ASPIRIN-CAFFEINE PO
10.00 | ORAL | Status: DC
Start: ? — End: 2019-10-02

## 2019-10-02 MED ORDER — EQ LUBRICANT EYE DROPS OP
650.00 | OPHTHALMIC | Status: DC
Start: 2019-10-02 — End: 2019-10-02

## 2019-10-02 MED ORDER — FP LECITHIN 1200 MG PO CAPS
1.00 | ORAL_CAPSULE | ORAL | Status: DC
Start: 2019-10-05 — End: 2019-10-02

## 2019-10-02 MED ORDER — Medication
10.00 | Status: DC
Start: ? — End: 2019-10-02

## 2019-10-02 NOTE — Telephone Encounter (Signed)
Returned patient's phone call regarding rescheduling 12/10 appointment, per patient's request appointment has moved to 12/14.

## 2019-10-06 ENCOUNTER — Telehealth: Payer: Self-pay | Admitting: *Deleted

## 2019-10-06 NOTE — Telephone Encounter (Signed)
Left message requesting verbal order to approve skilled nursing visit once/week x 1; twice week x 3; and once/week x 1 to do PAC dressing changes, draw labs, monitor TPN and care for G-tube. Per Dr. Benay Spice: Dr. Clovis Riley at [facility] needs to provide these orders--this is surgical issue. Called nurse, Katie and left VM with Dr. Gearldine Shown response.

## 2019-10-08 ENCOUNTER — Ambulatory Visit: Payer: BC Managed Care – PPO | Admitting: Nurse Practitioner

## 2019-10-12 ENCOUNTER — Telehealth: Payer: Self-pay

## 2019-10-12 ENCOUNTER — Inpatient Hospital Stay: Payer: BC Managed Care – PPO | Admitting: Nurse Practitioner

## 2019-10-12 ENCOUNTER — Telehealth: Payer: Self-pay | Admitting: *Deleted

## 2019-10-12 NOTE — Telephone Encounter (Signed)
Documentation reviewed to contact patient to schedule Palliative care visit, noted patient is currently admitted to [facility]. Will follow up after patient returns home.

## 2019-10-12 NOTE — Telephone Encounter (Addendum)
Was re-admitted to [facility] and is still there. May d/c later today. Will reschedule F/U at that time. Called back to inquire if she has been discharged yet today and was informed no. Working on getting arrangements taken care of for home. She is in a lot of pain and was told there is nothing else that can be done for her. She has agreed to [facility] care. She agrees to call and let us know when she is home and settled, because she would like a telephone or virtual visit with Dr. Benay Spice. Informed her that once she is home

## 2019-10-14 ENCOUNTER — Telehealth: Payer: Self-pay

## 2019-10-14 ENCOUNTER — Ambulatory Visit: Payer: BC Managed Care – PPO | Admitting: Family

## 2019-10-14 MED ORDER — DEXTROSE 10 % IV SOLN
125.00 | INTRAVENOUS | Status: DC
Start: ? — End: 2019-10-14

## 2019-10-14 MED ORDER — INSULIN LISPRO 100 UNIT/ML ~~LOC~~ SOLN
2.00 | SUBCUTANEOUS | Status: DC
Start: 2019-10-14 — End: 2019-10-14

## 2019-10-14 MED ORDER — PROCHLORPERAZINE EDISYLATE 10 MG/2ML IJ SOLN
5.00 | INTRAMUSCULAR | Status: DC
Start: ? — End: 2019-10-14

## 2019-10-14 MED ORDER — LIDOCAINE-PRILOCAINE 2.5-2.5 % EX CREA
1.00 | TOPICAL_CREAM | CUTANEOUS | Status: DC
Start: ? — End: 2019-10-14

## 2019-10-14 MED ORDER — LEVOTHYROXINE SODIUM 88 MCG PO TABS
88.00 | ORAL_TABLET | ORAL | Status: DC
Start: 2019-10-15 — End: 2019-10-14

## 2019-10-14 MED ORDER — PANTOPRAZOLE SODIUM 40 MG PO TBEC
40.00 | DELAYED_RELEASE_TABLET | ORAL | Status: DC
Start: 2019-10-14 — End: 2019-10-14

## 2019-10-14 MED ORDER — ONDANSETRON 8 MG PO TBDP
8.00 | ORAL_TABLET | ORAL | Status: DC
Start: ? — End: 2019-10-14

## 2019-10-14 MED ORDER — DEXAMETHASONE 4 MG PO TABS
8.00 | ORAL_TABLET | ORAL | Status: DC
Start: 2019-10-15 — End: 2019-10-14

## 2019-10-14 MED ORDER — NYSTATIN 100000 UNIT/GM EX POWD
CUTANEOUS | Status: DC
Start: 2019-10-14 — End: 2019-10-14

## 2019-10-14 MED ORDER — SODIUM CHLORIDE FLUSH 0.9 % IV SOLN
10.00 | INTRAVENOUS | Status: DC
Start: 2019-10-14 — End: 2019-10-14

## 2019-10-14 MED ORDER — OLANZAPINE 5 MG PO TBDP
5.00 | ORAL_TABLET | ORAL | Status: DC
Start: 2019-10-14 — End: 2019-10-14

## 2019-10-14 MED ORDER — FENTANYL 75 MCG/HR TD PT72
1.00 | MEDICATED_PATCH | TRANSDERMAL | Status: DC
Start: 2019-10-15 — End: 2019-10-14

## 2019-10-14 MED ORDER — GENERIC EXTERNAL MEDICATION
Status: DC
Start: ? — End: 2019-10-14

## 2019-10-14 MED ORDER — LORAZEPAM 0.5 MG PO TABS
0.50 | ORAL_TABLET | ORAL | Status: DC
Start: ? — End: 2019-10-14

## 2019-10-14 MED ORDER — GLUCOSE 40 % PO GEL
15.00 | ORAL | Status: DC
Start: ? — End: 2019-10-14

## 2019-10-14 MED ORDER — SODIUM CHLORIDE FLUSH 0.9 % IV SOLN
20.00 | INTRAVENOUS | Status: DC
Start: ? — End: 2019-10-14

## 2019-10-14 NOTE — Telephone Encounter (Signed)
Received notification that patient is now under hospice services. Will cancel Palliative care referral.

## 2019-10-15 ENCOUNTER — Telehealth: Payer: Self-pay | Admitting: *Deleted

## 2019-10-15 NOTE — Telephone Encounter (Signed)
Received a telephone message from Eulas Post, Oncology Coordinator at [facility]. Patient was discharged from the hospital yesterday. She went home with hospice. MD notified.

## 2019-10-30 DEATH — deceased

## 2020-02-13 IMAGING — CT CT BIOPSY
1 of 6 series · 14 of 32 positions shown, 19 images · non-contrast
Comparison: none

INDICATION: 57-year-old with peritoneal carcinomatosis and needs a tissue
diagnosis. Concern for primary ovarian cancer.

[Series 2: i-spiral 5.0 b31f · axial · 0.98mm/px · z∈[+1194,+1488]mm · 14 of 97 slices shown, 19 images]
[im 7/97  soft-tissue]
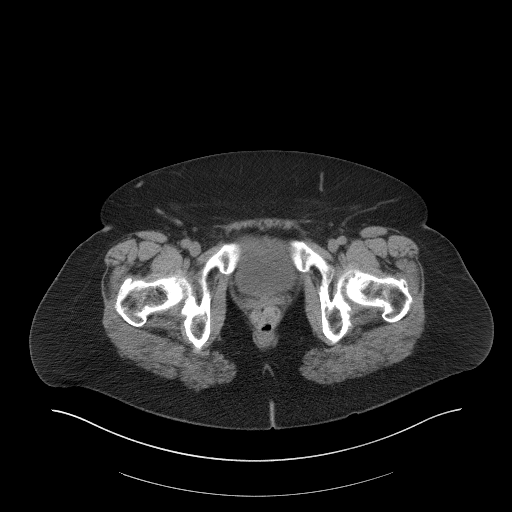
[im 7/97  bone]
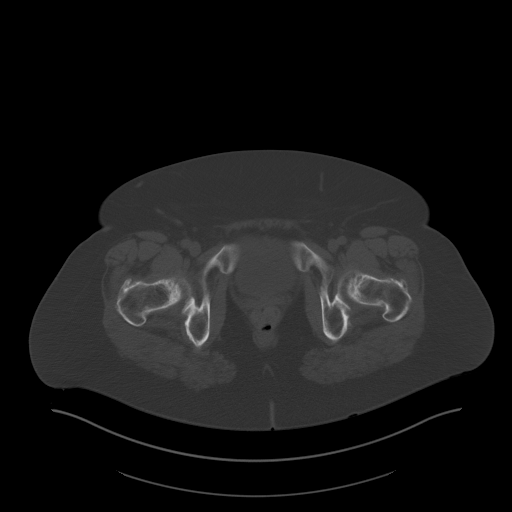
[im 13/97  soft-tissue]
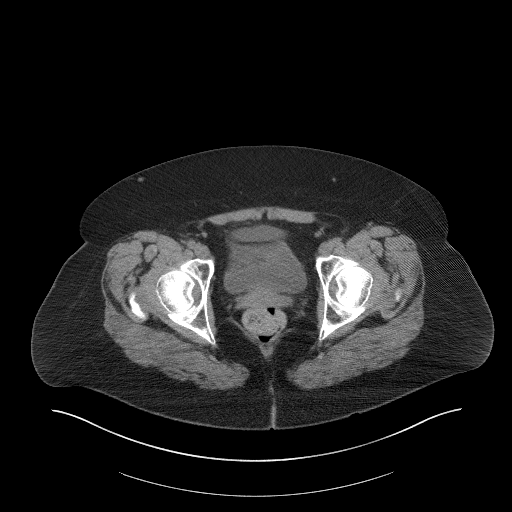
[im 19/97  soft-tissue]
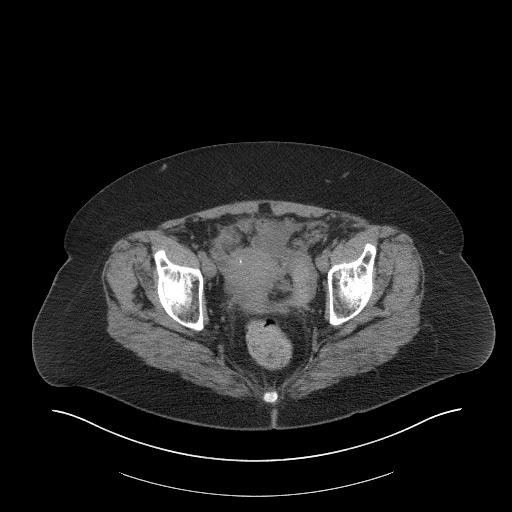
[im 31/97  soft-tissue]
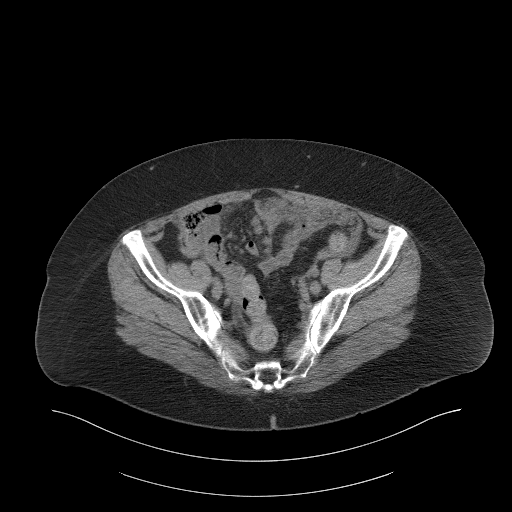
[im 37/97  soft-tissue]
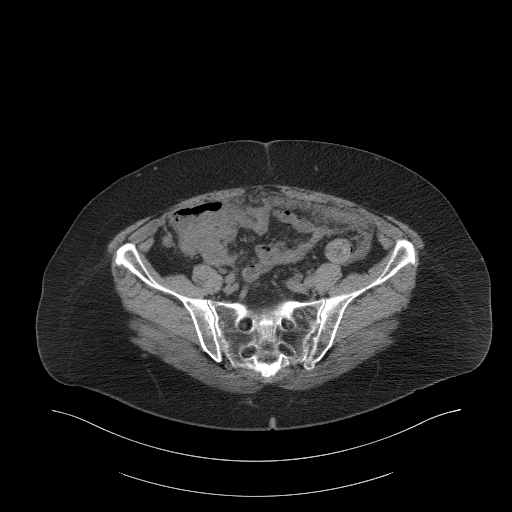
[im 43/97  soft-tissue]
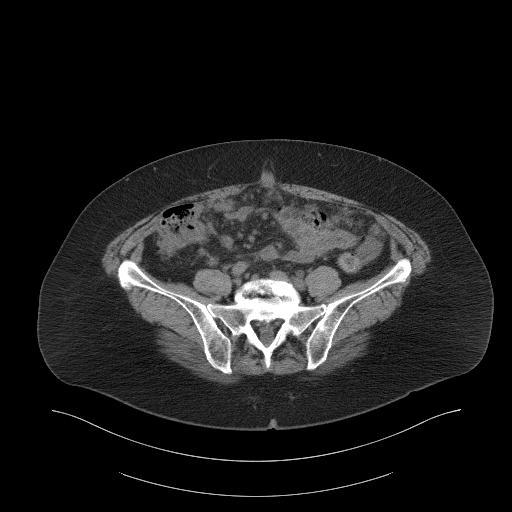
[im 49/97  soft-tissue]
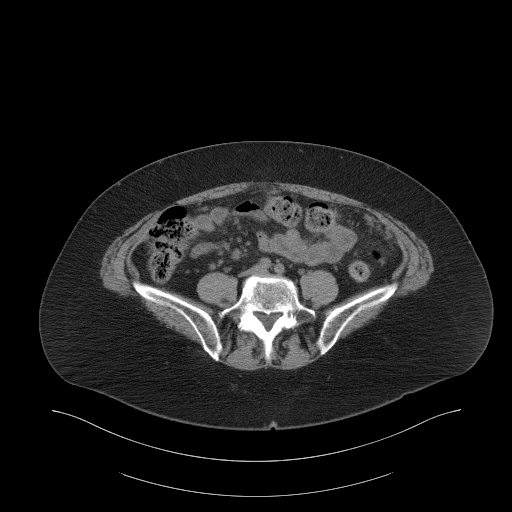
[im 55/97  soft-tissue]
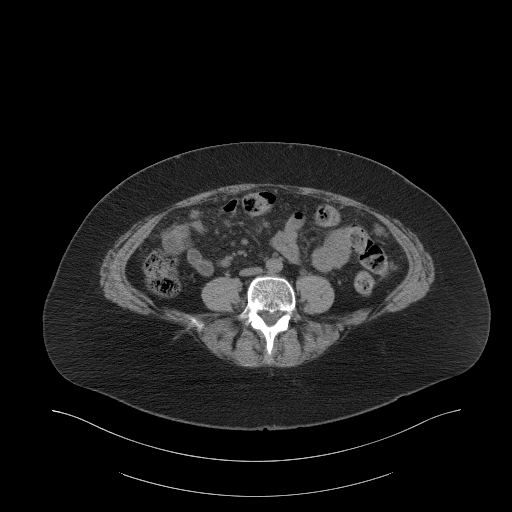
[im 61/97  soft-tissue]
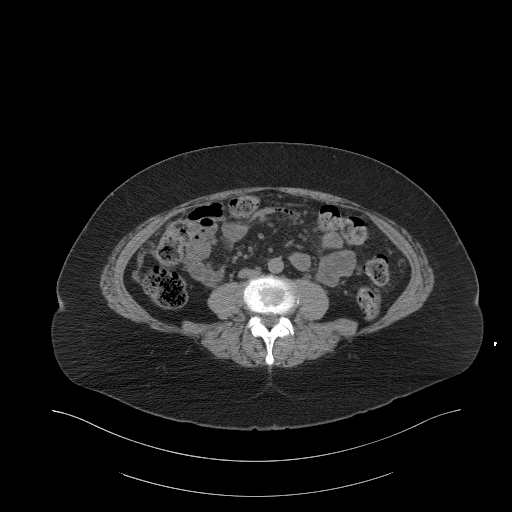
[im 61/97  bone]
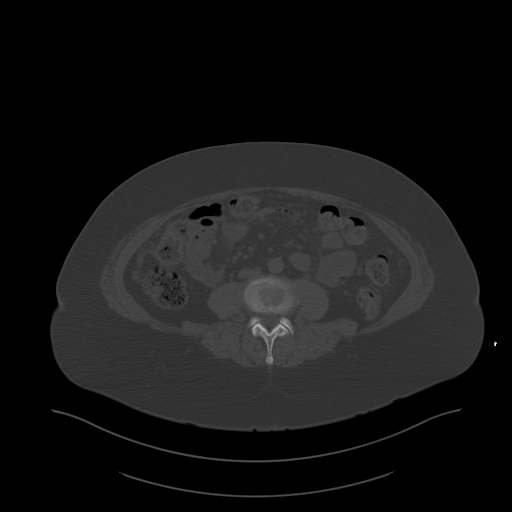
[im 67/97  soft-tissue]
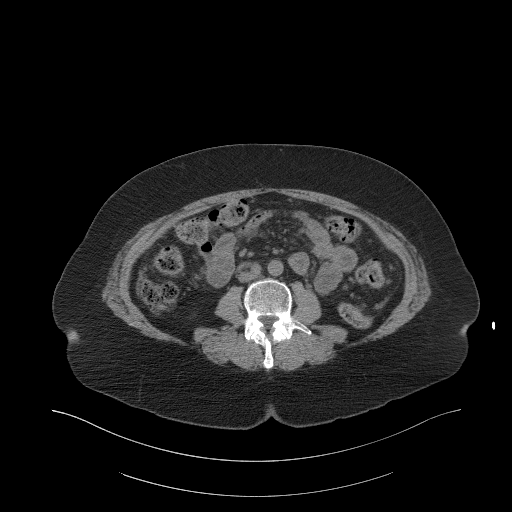
[im 73/97  lung]
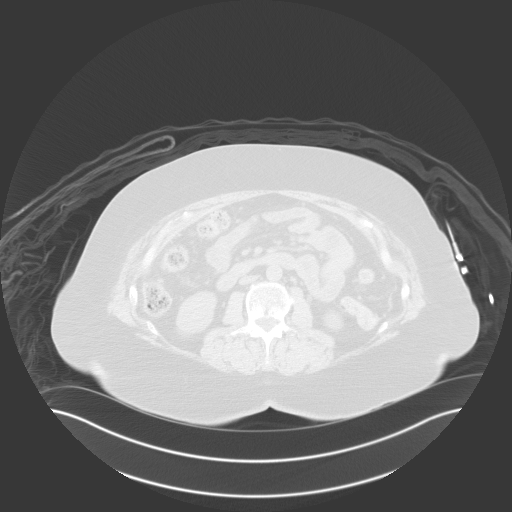
[im 79/97  soft-tissue]
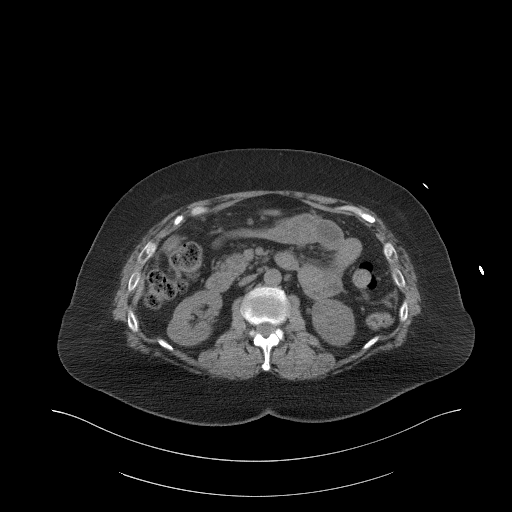
[im 79/97  lung]
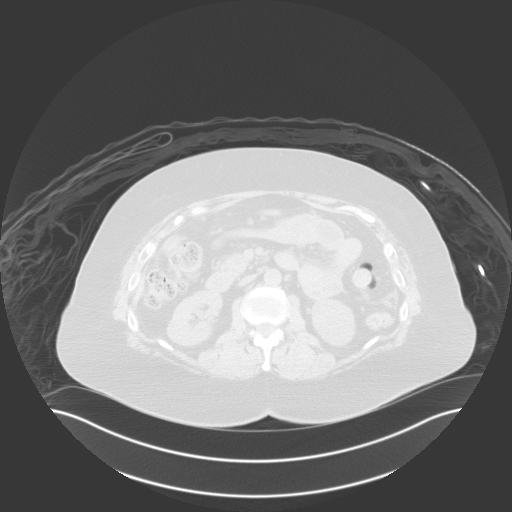
[im 85/97  soft-tissue]
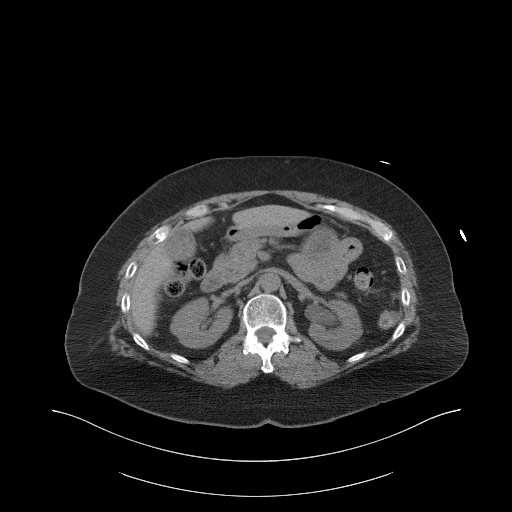
[im 85/97  lung]
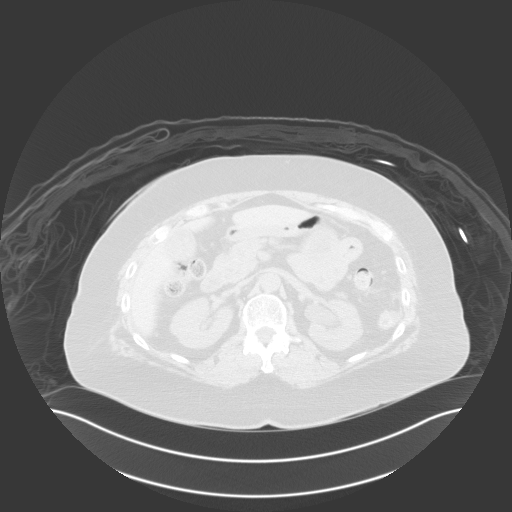
[im 91/97  soft-tissue]
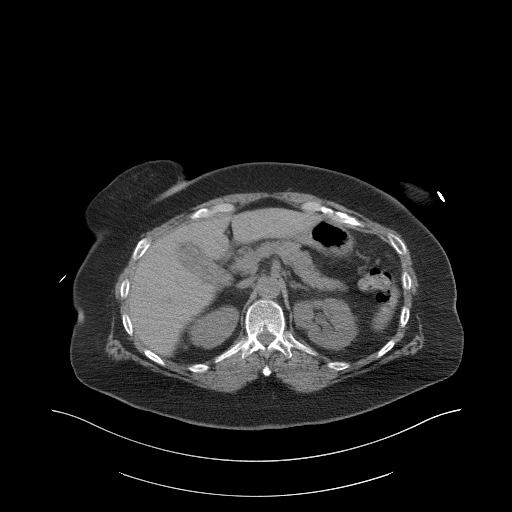
[im 91/97  lung]
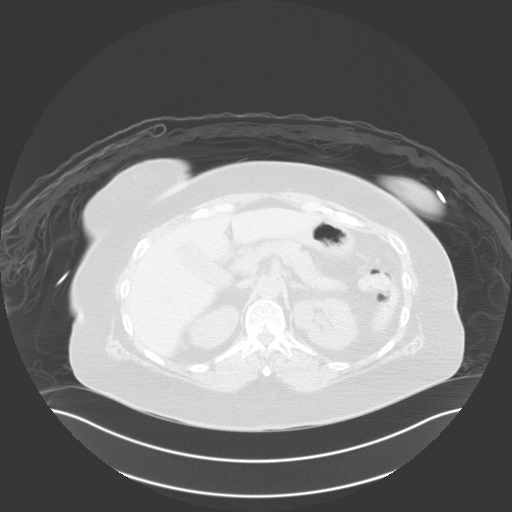

[14 of 32 positions shown; findings below may reference images not displayed]

EXAM:
CT-GUIDED OMENTAL/PERITONEAL BIOPSY

MEDICATIONS:
None.

ANESTHESIA/SEDATION:
Moderate (conscious) sedation was employed during this procedure. A
total of Versed 4.0 mg and Fentanyl 100 mcg was administered
intravenously.

Moderate Sedation Time: 20 minutes. The patient's level of
consciousness and vital signs were monitored continuously by
radiology nursing throughout the procedure under my direct
supervision.

FLUOROSCOPY TIME:  None

COMPLICATIONS:
None immediate.

PROCEDURE:
Informed written consent was obtained from the patient after a
thorough discussion of the procedural risks, benefits and
alternatives. All questions were addressed. A timeout was performed
prior to the initiation of the procedure.

Patient was placed supine and CT images through the lower abdomen
were obtained. Omental caking in the left lower quadrant was
targeted for biopsy. The left lower abdomen was prepped with
chlorhexidine and sterile field was created. Skin and soft tissues
were anesthetized with 1% lidocaine. A 17 gauge coaxial needle was
directed into the left lower quadrant omental thickening. Needle
position was confirmed within the omental thickening. Three core
biopsies were obtained with an 18 gauge core device. Adequate
specimens were obtained and placed in formalin.
FINDINGS: Extensive nodularity throughout the peritoneum with focal omental
thickening or caking in the mid and left lower abdomen. Needle
position was confirmed within the omental thickening. Three adequate
core biopsies were obtained.
IMPRESSION: Successful CT-guided core biopsy of the omental thickening in the
left lower abdomen.

## 2020-02-25 IMAGING — US IR FLUORO GUIDE CV LINE*L*
1 series · 1 of 1 positions shown · non-contrast
Comparison: none

CLINICAL DATA: Peritoneal carcinomatosis with metastatic
adenocarcinoma and likely primary source of cecal carcinoma by
imaging. A Port-A-Cath has been requested prior to initiating
chemotherapy.

[Series 1: ir fluoro/shunt/fist · 1 of 1 slices shown]
[im 1/1]
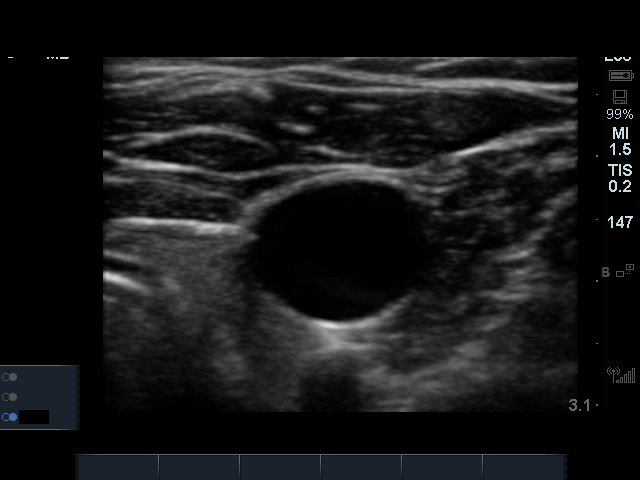

[1 of 1 positions shown; findings below may reference images not displayed]

EXAM:
IMPLANTED PORT A CATH PLACEMENT WITH ULTRASOUND AND FLUOROSCOPIC
GUIDANCE

ANESTHESIA/SEDATION:
2.0 mg IV Versed; 100 mcg IV Fentanyl

Total Moderate Sedation Time:  36 minutes

The patient's level of consciousness and physiologic status were
continuously monitored during the procedure by Radiology nursing.

Additional Medications: 2 g IV Ancef.

FLUOROSCOPY TIME:  1 minutes and 18 seconds.  18.5 mGy.

PROCEDURE:
The procedure, risks, benefits, and alternatives were explained to
the patient. Questions regarding the procedure were encouraged and
answered. The patient understands and consents to the procedure. A
time-out was performed prior to initiating the procedure.

Ultrasound was utilized to confirm patency of the right internal
jugular vein. The right neck and chest were prepped with
chlorhexidine in a sterile fashion, and a sterile drape was applied
covering the operative field. Maximum barrier sterile technique with
sterile gowns and gloves were used for the procedure. Local
anesthesia was provided with 1% lidocaine.

After creating a small venotomy incision, a 21 gauge needle was
advanced into the right internal jugular vein under direct,
real-time ultrasound guidance. Ultrasound image documentation was
performed. After securing guidewire access, an 8 Fr dilator was
placed. A J-wire was kinked to measure appropriate catheter length.

A subcutaneous port pocket was then created along the upper chest
wall utilizing sharp and blunt dissection. Portable cautery was
utilized. The pocket was irrigated with sterile saline.

A single lumen power injectable port was chosen for placement. The 8
Fr catheter was tunneled from the port pocket site to the venotomy
incision. The port was placed in the pocket. External catheter was
trimmed to appropriate length based on guidewire measurement.

At the venotomy, an 8 Fr peel-away sheath was placed over a
guidewire. The catheter was then placed through the sheath and the
sheath removed. Final catheter positioning was confirmed and
documented with a fluoroscopic spot image. The port was accessed
with a needle and aspirated and flushed with heparinized saline. The
access needle was removed.

The venotomy and port pocket incisions were closed with subcutaneous
3-0 Monocryl and subcuticular 4-0 Vicryl. Dermabond was applied to
both incisions.

COMPLICATIONS:
COMPLICATIONS
None
FINDINGS: After catheter placement, the tip lies at the [place] junction.
The catheter aspirates normally and is ready for immediate use.
IMPRESSION: Placement of single lumen port a cath via right internal jugular
vein. The catheter tip lies at the [place] junction. A power
injectable port a cath was placed and is ready for immediate use.
